# Patient Record
Sex: Male | Born: 1990
Health system: Southern US, Community
[De-identification: ages and names within clinical notes are randomized; demographics above are authoritative.]

## PROBLEM LIST (undated history)

## (undated) DIAGNOSIS — T7840XA Allergy, unspecified, initial encounter: Secondary | ICD-10-CM

## (undated) HISTORY — DX: Allergy, unspecified, initial encounter: T78.40XA

---

## 2019-02-14 HISTORY — PX: LUMBAR DISC SURGERY: SHX700

## 2019-08-06 DIAGNOSIS — M5430 Sciatica, unspecified side: Secondary | ICD-10-CM | POA: Diagnosis not present

## 2019-08-06 DIAGNOSIS — M9903 Segmental and somatic dysfunction of lumbar region: Secondary | ICD-10-CM | POA: Diagnosis not present

## 2019-08-07 DIAGNOSIS — M5432 Sciatica, left side: Secondary | ICD-10-CM | POA: Diagnosis not present

## 2019-08-07 DIAGNOSIS — M9903 Segmental and somatic dysfunction of lumbar region: Secondary | ICD-10-CM | POA: Diagnosis not present

## 2019-08-11 DIAGNOSIS — M9903 Segmental and somatic dysfunction of lumbar region: Secondary | ICD-10-CM | POA: Diagnosis not present

## 2019-08-11 DIAGNOSIS — M62830 Muscle spasm of back: Secondary | ICD-10-CM | POA: Diagnosis not present

## 2019-08-13 DIAGNOSIS — M9903 Segmental and somatic dysfunction of lumbar region: Secondary | ICD-10-CM | POA: Diagnosis not present

## 2019-08-13 DIAGNOSIS — M62830 Muscle spasm of back: Secondary | ICD-10-CM | POA: Diagnosis not present

## 2019-08-14 DIAGNOSIS — M9903 Segmental and somatic dysfunction of lumbar region: Secondary | ICD-10-CM | POA: Diagnosis not present

## 2019-08-14 DIAGNOSIS — M62830 Muscle spasm of back: Secondary | ICD-10-CM | POA: Diagnosis not present

## 2019-08-18 DIAGNOSIS — M9903 Segmental and somatic dysfunction of lumbar region: Secondary | ICD-10-CM | POA: Diagnosis not present

## 2019-08-18 DIAGNOSIS — M62830 Muscle spasm of back: Secondary | ICD-10-CM | POA: Diagnosis not present

## 2019-08-20 DIAGNOSIS — M62830 Muscle spasm of back: Secondary | ICD-10-CM | POA: Diagnosis not present

## 2019-08-20 DIAGNOSIS — M9903 Segmental and somatic dysfunction of lumbar region: Secondary | ICD-10-CM | POA: Diagnosis not present

## 2019-08-21 DIAGNOSIS — M9903 Segmental and somatic dysfunction of lumbar region: Secondary | ICD-10-CM | POA: Diagnosis not present

## 2019-08-21 DIAGNOSIS — M62830 Muscle spasm of back: Secondary | ICD-10-CM | POA: Diagnosis not present

## 2019-08-25 DIAGNOSIS — M5432 Sciatica, left side: Secondary | ICD-10-CM | POA: Diagnosis not present

## 2019-08-25 DIAGNOSIS — M9903 Segmental and somatic dysfunction of lumbar region: Secondary | ICD-10-CM | POA: Diagnosis not present

## 2019-08-27 DIAGNOSIS — M9903 Segmental and somatic dysfunction of lumbar region: Secondary | ICD-10-CM | POA: Diagnosis not present

## 2019-08-27 DIAGNOSIS — M62830 Muscle spasm of back: Secondary | ICD-10-CM | POA: Diagnosis not present

## 2019-09-03 DIAGNOSIS — M5432 Sciatica, left side: Secondary | ICD-10-CM | POA: Diagnosis not present

## 2019-09-03 DIAGNOSIS — M9903 Segmental and somatic dysfunction of lumbar region: Secondary | ICD-10-CM | POA: Diagnosis not present

## 2019-09-03 DIAGNOSIS — M62830 Muscle spasm of back: Secondary | ICD-10-CM | POA: Diagnosis not present

## 2019-09-04 DIAGNOSIS — M9903 Segmental and somatic dysfunction of lumbar region: Secondary | ICD-10-CM | POA: Diagnosis not present

## 2019-09-04 DIAGNOSIS — M5430 Sciatica, unspecified side: Secondary | ICD-10-CM | POA: Diagnosis not present

## 2019-09-08 DIAGNOSIS — M9903 Segmental and somatic dysfunction of lumbar region: Secondary | ICD-10-CM | POA: Diagnosis not present

## 2019-09-08 DIAGNOSIS — M5430 Sciatica, unspecified side: Secondary | ICD-10-CM | POA: Diagnosis not present

## 2019-09-10 DIAGNOSIS — M9903 Segmental and somatic dysfunction of lumbar region: Secondary | ICD-10-CM | POA: Diagnosis not present

## 2019-09-10 DIAGNOSIS — M5430 Sciatica, unspecified side: Secondary | ICD-10-CM | POA: Diagnosis not present

## 2019-09-12 DIAGNOSIS — M5432 Sciatica, left side: Secondary | ICD-10-CM | POA: Diagnosis not present

## 2019-09-12 DIAGNOSIS — M5431 Sciatica, right side: Secondary | ICD-10-CM | POA: Diagnosis not present

## 2019-09-15 DIAGNOSIS — M5430 Sciatica, unspecified side: Secondary | ICD-10-CM | POA: Diagnosis not present

## 2019-09-15 DIAGNOSIS — M9903 Segmental and somatic dysfunction of lumbar region: Secondary | ICD-10-CM | POA: Diagnosis not present

## 2019-09-17 DIAGNOSIS — M9903 Segmental and somatic dysfunction of lumbar region: Secondary | ICD-10-CM | POA: Diagnosis not present

## 2019-09-17 DIAGNOSIS — M5430 Sciatica, unspecified side: Secondary | ICD-10-CM | POA: Diagnosis not present

## 2019-09-24 DIAGNOSIS — M5430 Sciatica, unspecified side: Secondary | ICD-10-CM | POA: Diagnosis not present

## 2019-09-24 DIAGNOSIS — M9903 Segmental and somatic dysfunction of lumbar region: Secondary | ICD-10-CM | POA: Diagnosis not present

## 2019-09-25 DIAGNOSIS — M545 Low back pain: Secondary | ICD-10-CM | POA: Diagnosis not present

## 2019-09-25 DIAGNOSIS — M5116 Intervertebral disc disorders with radiculopathy, lumbar region: Secondary | ICD-10-CM | POA: Diagnosis not present

## 2019-10-24 DIAGNOSIS — M5116 Intervertebral disc disorders with radiculopathy, lumbar region: Secondary | ICD-10-CM | POA: Diagnosis not present

## 2019-11-26 DIAGNOSIS — M5116 Intervertebral disc disorders with radiculopathy, lumbar region: Secondary | ICD-10-CM | POA: Diagnosis not present

## 2019-11-28 DIAGNOSIS — M5117 Intervertebral disc disorders with radiculopathy, lumbosacral region: Secondary | ICD-10-CM | POA: Diagnosis not present

## 2019-11-28 DIAGNOSIS — M5126 Other intervertebral disc displacement, lumbar region: Secondary | ICD-10-CM | POA: Diagnosis not present

## 2020-03-16 DIAGNOSIS — Z4889 Encounter for other specified surgical aftercare: Secondary | ICD-10-CM | POA: Diagnosis not present

## 2020-03-30 ENCOUNTER — Encounter: Payer: Self-pay | Admitting: Physical Therapy

## 2020-03-30 ENCOUNTER — Other Ambulatory Visit: Payer: Self-pay

## 2020-03-30 ENCOUNTER — Ambulatory Visit: Payer: BC Managed Care – PPO | Attending: Orthopedic Surgery | Admitting: Physical Therapy

## 2020-03-30 DIAGNOSIS — M545 Low back pain, unspecified: Secondary | ICD-10-CM | POA: Insufficient documentation

## 2020-03-30 DIAGNOSIS — G8929 Other chronic pain: Secondary | ICD-10-CM | POA: Diagnosis not present

## 2020-03-30 DIAGNOSIS — R293 Abnormal posture: Secondary | ICD-10-CM | POA: Diagnosis not present

## 2020-03-30 NOTE — Therapy (Signed)
[facility] [address] [place], [place], [address] Phone: [phone]   Fax:  [phone]  Physical Therapy Evaluation  Patient Details  Name: Sanders Manninen MRN: [id number] Date of Birth: 1990/08/06 Referring Provider (PT): Venita Lick MD   Encounter Date: 03/30/2020   PT End of Session - 03/30/20 1440    Visit Number 1    Number of Visits 12    Date for PT Re-Evaluation 04/27/20    Authorization Type FOTO.    PT Start Time 0145    PT Stop Time 0227    PT Time Calculation (min) 42 min    Activity Tolerance Patient tolerated treatment well    Behavior During Therapy [facility] for tasks assessed/performed           History reviewed. No pertinent past medical history.  History reviewed. No pertinent surgical history.  There were no vitals filed for this visit.    Subjective Assessment - 03/30/20 1440    Subjective COVID-19 screen performed prior to patient entering clinic.  The patient presents to the clinic today s/p lumbar discectomy performed on 11/28/19.  he is pleased with the outcome of his surgery thus far and reports a low pain-level of 2/10 today. If he moves quickly is pain can rise to high levels but do not tend to last long.  Occasional he feels an ache in his left big toe but again not long lasting.  He states his surgeon said he can begin doing some lifting at home.    Pertinent History Unramarkable.    How long can you sit comfortably? Long time with readjustments.    How long can you walk comfortably? Coouple of blocks or more with some difficulty.    Patient Stated Goals Return to work (he works at a [employer]).    Currently in Pain? Yes    Pain Score 2     Pain Location Back    Pain Orientation Left    Pain Descriptors / Indicators Sore   "pulling."   Pain Type Surgical pain    Pain Onset More than a month ago    Pain Frequency Intermittent    Aggravating Factors  "Varies."    Pain Relieving Factors  Readjusting positions.              [facility] PT Assessment - 03/30/20 0001      Assessment   Medical Diagnosis Lumbar pain.    Referring Provider (PT) Venita Lick MD    Onset Date/Surgical Date 11/28/19      Precautions   Precaution Comments See lumbar discectomy protocol.      Restrictions   Weight Bearing Restrictions No      Balance Screen   Has the patient fallen in the past 6 months No    Has the patient had a decrease in activity level because of a fear of falling?  No    Is the patient reluctant to leave their home because of a fear of falling?  No      Home Environment   Living Environment Private residence      Prior Function   Level of Independence Independent      Observation/Other Assessments   Observations Lumbar incision appears to be well healed.    Focus on Therapeutic Outcomes (FOTO)  Complete.      Posture/Postural Control   Posture/Postural Control Postural limitations    Postural Limitations Decreased lumbar lordosis      Deep Tendon Reflexes  DTR Assessment Site Patella;Achilles    Patella DTR 2+    Achilles DTR 2+      ROM / Strength   AROM / PROM / Strength AROM;Strength      AROM   Overall AROM Comments Lumbar flexion limited by 25% and extension to 20 degrees.      Strength   Overall Strength Comments Normal left knee and ankle strength.      Palpation   Palpation comment Tender on left above lumbar incisional site over erector spinae musculature.      Ambulation/Gait   Gait Comments Essentially normal gait cycle.                      Objective measurements completed on examination: See above findings.       [facility] Adult PT Treatment/Exercise - 03/30/20 0001      Modalities   Modalities Electrical Stimulation;Moist Heat      Moist Heat Therapy   Number Minutes Moist Heat 15 Minutes    Moist Heat Location Lumbar Spine      Electrical Stimulation   Electrical Stimulation Location Low back.    Electrical  Stimulation Action IFC at 80-150 Hz x 15 minutes.    Electrical Stimulation Parameters 40% scan.    Electrical Stimulation Goals Pain;Tone                       PT Long Term Goals - 03/30/20 1500      PT LONG TERM GOAL #1   Title Independent with a HEP.    Time 4    Period Weeks    Status New      PT LONG TERM GOAL #2   Title Perform ADL's with pain not > 1-2/10.    Time 4    Period Weeks    Status New      PT LONG TERM GOAL #3   Title Return to work.    Time 4    Period Weeks    Status New                  Plan - 03/30/20 1452    Clinical Impression Statement The patient presents to [facility] s/p lumbar discectomy performed on 11/28/19.  He is very pleased with his surgical outcome thus far.  He has occasional but short lived pain increases with quiack movements.  He has some tenderness in his left lumbar erector spinae musculature above his incisional site.  His active spinal range of motion is quite good and his left knee and ankle strength is normal.  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  Patient will benefit from skilled physical therapy intervention to address pain and deficits.    Personal Factors and Comorbidities Profession;Other    Examination-Activity Limitations Other    Examination-Participation Restrictions Other    Stability/Clinical Decision Making Stable/Uncomplicated    Clinical Decision Making Low    Rehab Potential Excellent    PT Frequency 3x / week    PT Duration 4 weeks    PT Treatment/Interventions ADLs/Self Care Home Management;Cryotherapy;Electrical Stimulation;Ultrasound;Moist Heat;Functional mobility training;Therapeutic activities;Therapeutic exercise;Manual techniques;Patient/family education;Passive range of motion    PT Next Visit Plan Core exercise progression and instruct oncorrect body mechanics and lifting technique.  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    Consulted and Agree  with Plan of Care Patient  Patient will benefit from skilled therapeutic intervention in order to improve the following deficits and impairments:  Pain,Increased muscle spasms,Postural dysfunction,Decreased activity tolerance  Visit Diagnosis: Chronic left-sided low back pain without sciatica - Plan: PT plan of care cert/re-cert  Abnormal posture - Plan: PT plan of care cert/re-cert     Problem List There are no problems to display for this patient.   Malachy Coleman, [place] MPT 03/30/2020, 3:03 PM  [facility] [address] [place], [place], [address] Phone: [phone]   Fax:  [phone]  Name: Erek Kowal MRN: [id number] Date of Birth: 21-Aug-1990

## 2020-04-01 ENCOUNTER — Ambulatory Visit: Payer: BC Managed Care – PPO | Admitting: *Deleted

## 2020-04-01 ENCOUNTER — Other Ambulatory Visit: Payer: Self-pay

## 2020-04-01 DIAGNOSIS — M545 Low back pain, unspecified: Secondary | ICD-10-CM | POA: Diagnosis not present

## 2020-04-01 DIAGNOSIS — G8929 Other chronic pain: Secondary | ICD-10-CM | POA: Diagnosis not present

## 2020-04-01 DIAGNOSIS — R293 Abnormal posture: Secondary | ICD-10-CM | POA: Diagnosis not present

## 2020-04-01 NOTE — Therapy (Signed)
[facility] [address] [place], [place], [address] Phone: [phone]   Fax:  [phone]  Physical Therapy Treatment  Patient Details  Name: Purcell Jungbluth MRN: [id number] Date of Birth: 05-30-1990 Referring Provider (PT): Venita Lick MD   Encounter Date: 04/01/2020   PT End of Session - 04/01/20 1342    Visit Number 2    Number of Visits 12    Date for PT Re-Evaluation 04/27/20    Authorization Type FOTO.    PT Start Time 1345    PT Stop Time 1435    PT Time Calculation (min) 50 min           No past medical history on file.  No past surgical history on file.  There were no vitals filed for this visit.   Subjective Assessment - 04/01/20 1341    Pertinent History Unramarkable.    How long can you sit comfortably? Long time with readjustments.    How long can you walk comfortably? Coouple of blocks or more with some difficulty.    Patient Stated Goals Return to work (he works at a [employer]).                             [facility] Adult PT Treatment/Exercise - 04/01/20 0001      Therapeutic Activites    Therapeutic Activities Lifting    Lifting discussed power lift and kneeling techniques      Exercises   Exercises Knee/Hip;Lumbar      Lumbar Exercises: Standing   Other Standing Lumbar Exercises Standing Lat pulldown at high cabinet x10 hold 10 secs      Lumbar Exercises: Supine   Ab Set 10 reps    Bent Knee Raise 20 reps   with AB brace   Other Supine Lumbar Exercises curl up 2x10 with 1 leg straight and one bent.      Lumbar Exercises: Quadruped   Madcat/Old Horse 10 reps    Other Quadruped Lumbar Exercises Modified wt shift for arms x 10 and LEs x10      Modalities   Modalities Electrical Stimulation;Moist Heat      Moist Heat Therapy   Number Minutes Moist Heat 15 Minutes    Moist Heat Location Lumbar Spine      Electrical Stimulation   Electrical Stimulation Location Low back.     Electrical Stimulation Action IFC at 80-150hz  x 15 mins    Electrical Stimulation Parameters 80-150 hz x 15 mins    Electrical Stimulation Goals Pain;Tone                       PT Long Term Goals - 03/30/20 1500      PT LONG TERM GOAL #1   Title Independent with a HEP.    Time 4    Period Weeks    Status New      PT LONG TERM GOAL #2   Title Perform ADL's with pain not > 1-2/10.    Time 4    Period Weeks    Status New      PT LONG TERM GOAL #3   Title Return to work.    Time 4    Period Weeks    Status New                 Plan - 04/01/20 1430    Clinical Impression Statement Pt arrived today doing  fairly well and mainly with soreness in LB Rx focused on HEP core activation/ stabilization exs and body mechanics . Handout given for HEP. Did well with estim/HMP    Personal Factors and Comorbidities Profession;Other    Examination-Activity Limitations Other    Stability/Clinical Decision Making Stable/Uncomplicated    Rehab Potential Excellent    PT Frequency 3x / week    PT Treatment/Interventions ADLs/Self Care Home Management;Cryotherapy;Electrical Stimulation;Ultrasound;Moist Heat;Functional mobility training;Therapeutic activities;Therapeutic exercise;Manual techniques;Patient/family education;Passive range of motion    PT Next Visit Plan Core exercise progression and instruct oncorrect body mechanics and lifting technique.  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           Patient will benefit from skilled therapeutic intervention in order to improve the following deficits and impairments:  Pain,Increased muscle spasms,Postural dysfunction,Decreased activity tolerance  Visit Diagnosis: Chronic left-sided low back pain without sciatica  Abnormal posture     Problem List There are no problems to display for this patient.   Estell Puccini,CHRIS, PTA 04/01/2020, 2:58 PM  [facility] [address] [place], [place], [address] Phone: [phone]   Fax:  [phone]  Name: Qusay Villada MRN: [id number] Date of Birth: 06/24/1990

## 2020-04-06 ENCOUNTER — Ambulatory Visit: Payer: BC Managed Care – PPO | Admitting: *Deleted

## 2020-04-06 ENCOUNTER — Other Ambulatory Visit: Payer: Self-pay

## 2020-04-06 DIAGNOSIS — M545 Low back pain, unspecified: Secondary | ICD-10-CM

## 2020-04-06 DIAGNOSIS — G8929 Other chronic pain: Secondary | ICD-10-CM | POA: Diagnosis not present

## 2020-04-06 DIAGNOSIS — R293 Abnormal posture: Secondary | ICD-10-CM

## 2020-04-06 NOTE — Therapy (Signed)
[facility] [address] [place], [place], [address] Phone: [phone]   Fax:  [phone]  Physical Therapy Treatment  Patient Details  Name: Duane Dixon MRN: [id number] Date of Birth: 17-Dec-1990 Referring Provider (PT): Venita Lick MD   Encounter Date: 04/06/2020   PT End of Session - 04/06/20 1403    Visit Number 3    Number of Visits 12    Date for PT Re-Evaluation 04/27/20    Authorization Type FOTO.    PT Start Time 1345    PT Stop Time 1436    PT Time Calculation (min) 51 min           No past medical history on file.  No past surgical history on file.  There were no vitals filed for this visit.                      [facility] Adult PT Treatment/Exercise - 04/06/20 0001      Exercises   Exercises Knee/Hip;Lumbar      Lumbar Exercises: Aerobic   Elliptical L5 R5  x 6 mins      Lumbar Exercises: Standing   Row Strengthening;Both;20 reps   XTS blue   Shoulder Extension Strengthening;20 reps   XTS blue     Lumbar Exercises: Supine   Ab Set 10 reps    Bent Knee Raise 20 reps   with AB brace   Other Supine Lumbar Exercises curl up 2x10 with 1 leg straight and one bent.      Lumbar Exercises: Quadruped   Madcat/Old Horse 10 reps    Single Arm Raise 10 reps    Straight Leg Raises Limitations Wt shift with knee lift off table      Modalities   Modalities Electrical Stimulation;Moist Heat      Moist Heat Therapy   Number Minutes Moist Heat 12 Minutes    Moist Heat Location Lumbar Spine      Electrical Stimulation   Electrical Stimulation Location Low back.    Electrical Stimulation Action IFC    Electrical Stimulation Parameters 80-150hz  x 12 mins    Electrical Stimulation Goals Pain;Tone                       PT Long Term Goals - 03/30/20 1500      PT LONG TERM GOAL #1   Title Independent with a HEP.    Time 4    Period Weeks    Status New      PT LONG TERM GOAL #2    Title Perform ADL's with pain not > 1-2/10.    Time 4    Period Weeks    Status New      PT LONG TERM GOAL #3   Title Return to work.    Time 4    Period Weeks    Status New                 Plan - 04/06/20 1406    Clinical Impression Statement Pt arrived today doing fairly well reporting a few episodes of pain with certain movements and LB soreness.Marland Kitchen Rx focused on core activation exs in gym as well as reviewing HEP. Improved AB bracing without holding his breath today.    Personal Factors and Comorbidities Profession;Other    Examination-Participation Restrictions Other    Stability/Clinical Decision Making Stable/Uncomplicated    Rehab Potential Excellent    PT Frequency 3x / week  PT Duration 4 weeks    PT Treatment/Interventions ADLs/Self Care Home Management;Cryotherapy;Electrical Stimulation;Ultrasound;Moist Heat;Functional mobility training;Therapeutic activities;Therapeutic exercise;Manual techniques;Patient/family education;Passive range of motion    PT Next Visit Plan Core exercise progression and instruct oncorrect body mechanics and lifting technique.  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    Consulted and Agree with Plan of Care Patient           Patient will benefit from skilled therapeutic intervention in order to improve the following deficits and impairments:  Pain,Increased muscle spasms,Postural dysfunction,Decreased activity tolerance  Visit Diagnosis: Chronic left-sided low back pain without sciatica  Abnormal posture     Problem List There are no problems to display for this patient.   Jhanae Jaskowiak,CHRIS, PTA 04/06/2020, 2:47 PM  [facility] [address] [place], [place], [address] Phone: [phone]   Fax:  [phone]  Name: Duane Dixon MRN: [id number] Date of Birth: December 14, 1990

## 2020-04-08 ENCOUNTER — Other Ambulatory Visit: Payer: Self-pay

## 2020-04-08 ENCOUNTER — Ambulatory Visit: Payer: BC Managed Care – PPO | Admitting: *Deleted

## 2020-04-08 DIAGNOSIS — G8929 Other chronic pain: Secondary | ICD-10-CM

## 2020-04-08 DIAGNOSIS — R293 Abnormal posture: Secondary | ICD-10-CM | POA: Diagnosis not present

## 2020-04-08 DIAGNOSIS — M545 Low back pain, unspecified: Secondary | ICD-10-CM | POA: Diagnosis not present

## 2020-04-08 NOTE — Therapy (Signed)
[facility] [address] [place], [place], [address] Phone: [phone]   Fax:  [phone]  Physical Therapy Treatment  Patient Details  Name: Abhiram Criado MRN: [id number] Date of Birth: 11-02-1990 Referring Provider (PT): Venita Lick MD   Encounter Date: 04/08/2020   PT End of Session - 04/08/20 1404    Visit Number 4    Number of Visits 12    Date for PT Re-Evaluation 04/27/20    Authorization Type FOTO.    PT Start Time 1400    PT Stop Time 1450    PT Time Calculation (min) 50 min           No past medical history on file.  No past surgical history on file.  There were no vitals filed for this visit.   Subjective Assessment - 04/08/20 1402    Subjective COVID-19 screen performed prior to patient entering clinic.Legs are sore after last Rx, but back is doing good2/10    Pertinent History Unramarkable.    How long can you sit comfortably? Long time with readjustments.    How long can you walk comfortably? Coouple of blocks or more with some difficulty.    Currently in Pain? 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      Lumbar Exercises: Quadruped   Madcat/Old Horse 10 reps    Single Arm Raise 20 reps    Straight Leg Raises  Limitations Wt shift with knee lift off table2x10      Modalities   Modalities Electrical Stimulation;Moist Heat      Moist Heat Therapy   Number Minutes Moist Heat 15 Minutes    Moist Heat Location Lumbar Spine      Electrical Stimulation   Electrical Stimulation Location Low back.    Electrical Stimulation Action IFC    Electrical Stimulation Parameters 80-150hz  x 15 mins    Electrical Stimulation Goals Pain;Tone                       PT Long Term Goals - 03/30/20 1500      PT LONG TERM GOAL #1   Title Independent with a HEP.    Time 4    Period Weeks    Status New      PT LONG TERM GOAL #2   Title Perform ADL's with pain not > 1-2/10.    Time 4    Period Weeks  Status New      PT LONG TERM GOAL #3   Title Return to work.    Time 4    Period Weeks    Status New                 Plan - 04/08/20 1404    Clinical Impression Statement Pt arrived today reporting doing better with abdominal bracing and exs. Powerlift position performed using red ex ball and with practicing sit to stand. Pt reports being really weak in his legs and was challenged  with power lift position. Try QP leg raise next Rx.    Personal Factors and Comorbidities Profession;Other    Examination-Activity Limitations Other    Rehab Potential Excellent    PT Frequency 3x / week    PT Duration 4 weeks    PT Treatment/Interventions ADLs/Self Care Home Management;Cryotherapy;Electrical Stimulation;Ultrasound;Moist Heat;Functional mobility training;Therapeutic activities;Therapeutic exercise;Manual techniques;Patient/family education;Passive range of motion    PT Next Visit Plan Core exercise progression and instruct oncorrect body mechanics and lifting technique.  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    Consulted and Agree with Plan of Care Patient           Patient will benefit from skilled therapeutic intervention in order to improve the following  deficits and impairments:  Pain,Increased muscle spasms,Postural dysfunction,Decreased activity tolerance  Visit Diagnosis: Chronic left-sided low back pain without sciatica  Abnormal posture     Problem List There are no problems to display for this patient.   Daisy Lites,CHRIS, PTA 04/08/2020, 2:53 PM  [facility] [address] [place], [place], [address] Phone: [phone]   Fax:  [phone]  Name: Darrel Gloss MRN: [id number] Date of Birth: [birth year]

## 2020-04-13 ENCOUNTER — Ambulatory Visit: Payer: BC Managed Care – PPO | Attending: Orthopedic Surgery | Admitting: *Deleted

## 2020-04-13 ENCOUNTER — Other Ambulatory Visit: Payer: Self-pay

## 2020-04-13 DIAGNOSIS — G8929 Other chronic pain: Secondary | ICD-10-CM | POA: Insufficient documentation

## 2020-04-13 DIAGNOSIS — R293 Abnormal posture: Secondary | ICD-10-CM | POA: Insufficient documentation

## 2020-04-13 DIAGNOSIS — M545 Low back pain, unspecified: Secondary | ICD-10-CM | POA: Insufficient documentation

## 2020-04-13 NOTE — Therapy (Signed)
[facility] [address] [place], [place], [address] Phone: [phone]   Fax:  [phone]  Physical Therapy Treatment  Patient Details  Name: Duane Dixon MRN: [id number] Date of Birth: [birth year] Referring Provider (PT): Venita Lick MD   Encounter Date: 04/13/2020   PT End of Session - 04/13/20 1359    Visit Number 5    Number of Visits 12    Date for PT Re-Evaluation 04/27/20    Authorization Type FOTO.    PT Start Time 1352    PT Stop Time 1439    PT Time Calculation (min) 47 min           No past medical history on file.  No past surgical history on file.  There were no vitals filed for this visit.   Subjective Assessment - 04/13/20 1358    Subjective COVID-19 screen performed prior to patient entering clinic.Legs are sore after last Rx, but back is doing good2/10    Pertinent History Unramarkable.    How long can you sit comfortably? Long time with readjustments.    How long can you walk comfortably? Coouple of blocks or more with some difficulty.    Patient Stated Goals Return to work (he works at a [employer]).    Currently in Pain? 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      Lumbar Exercises: Quadruped   Madcat/Old Horse 10  reps    Single Arm Raise 20 reps    Straight Leg Raise 20 reps;1 second      Modalities   Modalities Electrical Stimulation;Moist Heat      Moist Heat Therapy   Number Minutes Moist Heat 15 Minutes    Moist Heat Location Lumbar Spine      Electrical Stimulation   Electrical Stimulation Location Low back.    Electrical Stimulation Action IFC    Electrical Stimulation Parameters 80-150hz  x 15 mins    Electrical Stimulation Goals Pain;Tone                       PT Long Term Goals - 03/30/20 1500      PT LONG TERM GOAL #1   Title Independent with a HEP.    Time 4    Period Weeks    Status New      PT LONG TERM GOAL #2   Title Perform ADL's with pain not > 1-2/10.    Time 4  Period Weeks    Status New      PT LONG TERM GOAL #3   Title Return to work.    Time 4    Period Weeks    Status New                 Plan - 04/13/20 1359    Clinical Impression Statement Pt arrived today doing fairly well with minimal LBP, but reports having a twinge when getting up yesterday and was advised to perform AB bracing before getting out of bed. ADL/ work postures and body mechanics were performed with notable LE weakness as per Pt. No increased pain with Exs, mainly mm tightness.    Examination-Activity Limitations Other    Examination-Participation Restrictions Other    Stability/Clinical Decision Making Stable/Uncomplicated    Rehab Potential Excellent    PT Frequency 3x / week    PT Duration 4 weeks    PT Treatment/Interventions ADLs/Self Care Home Management;Cryotherapy;Electrical Stimulation;Ultrasound;Moist Heat;Functional mobility training;Therapeutic activities;Therapeutic exercise;Manual techniques;Patient/family education;Passive range of motion    PT Next Visit Plan Core exercise progression and instruct oncorrect body mechanics and lifting technique.  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           Patient will benefit  from skilled therapeutic intervention in order to improve the following deficits and impairments:     Visit Diagnosis: Chronic left-sided low back pain without sciatica  Abnormal posture     Problem List There are no problems to display for this patient.   Ethyl Vila,CHRIS, PTA 04/13/2020, 2:57 PM  [facility] [address] [place], [place], [address] Phone: [phone]   Fax:  [phone]  Name: Demetrius Mahler MRN: [id number] Date of Birth: Dec 04, 1990

## 2020-04-15 ENCOUNTER — Other Ambulatory Visit: Payer: Self-pay

## 2020-04-15 ENCOUNTER — Ambulatory Visit: Payer: BC Managed Care – PPO | Admitting: Physical Therapy

## 2020-04-15 DIAGNOSIS — G8929 Other chronic pain: Secondary | ICD-10-CM

## 2020-04-15 DIAGNOSIS — M545 Low back pain, unspecified: Secondary | ICD-10-CM

## 2020-04-15 DIAGNOSIS — R293 Abnormal posture: Secondary | ICD-10-CM | POA: Diagnosis not present

## 2020-04-15 NOTE — Therapy (Addendum)
[facility] [address] [place], [place], [address] Phone: [phone]   Fax:  [phone]  Physical Therapy Treatment  Patient Details  Name: Halbert Jesson MRN: [id number] Date of Birth: March 20, 1990 Referring Provider (PT): Venita Lick MD   Encounter Date: 04/15/2020   PT End of Session - 04/15/20 1416    Visit Number 6    Number of Visits 12    Date for PT Re-Evaluation 04/27/20    Authorization Type FOTO.    PT Start Time 1348    PT Stop Time 1433    PT Time Calculation (min) 45 min    Activity Tolerance Patient tolerated treatment well    Behavior During Therapy [facility] for tasks assessed/performed           No past medical history on file.  No past surgical history on file.  There were no vitals filed for this visit.   Subjective Assessment - 04/15/20 1415    Subjective COVID-19 screen performed prior to patient entering clinic. Pain varies with activities.    Pertinent History Unramarkable.    How long can you sit comfortably? Long time with readjustments.    How long can you walk comfortably? Coouple of blocks or more with some difficulty.    Patient Stated Goals Return to work (he works at a [employer]).    Currently in Pain? Yes    Pain Score 2     Pain Location Back    Pain Orientation Left;Lower    Pain Descriptors / Indicators Discomfort    Pain Type Surgical pain    Pain Onset More than a month ago    Pain Frequency Intermittent              [facility] PT Assessment - 04/15/20 0001      Assessment   Medical Diagnosis Lumbar pain.    Referring Provider (PT) Venita Lick MD    Onset Date/Surgical Date 11/28/19    Next MD Visit Unsure      Precautions   Precaution Comments See lumbar discectomy protocol.                         [facility] Adult PT Treatment/Exercise - 04/15/20 0001      Lumbar Exercises: Aerobic   Elliptical L5 R5  x 6 mins      Lumbar Exercises: Machines for Strengthening    Leg Press 2.5 pl, seat 6 x20 reps      Lumbar Exercises: Standing   Row Strengthening;Both;20 reps;Limitations    Row Limitations Blue XTS    Shoulder Extension Strengthening;Both;20 reps;Limitations    Shoulder Extension Limitations Blue XTS    Other Standing Lumbar Exercises B chop/lift blue XTS x20 reps each    Other Standing Lumbar Exercises modified squats to high plinth 10# x20 reps      Lumbar Exercises: Prone   Opposite Arm/Leg Raise Right arm/Left leg;Left arm/Right leg;10 reps      Lumbar Exercises: Quadruped   Madcat/Old Horse 10 reps    Single Arm Raise Right;Left;10 reps    Straight Leg Raise 20 reps;1 second             **Electrical stimulation: B low back     Pre-Mod      80-150 hz x10 min for pain control          PT Long Term Goals - 04/15/20 1417      PT LONG TERM GOAL #1  Title Independent with a HEP.    Time 4    Period Weeks    Status On-going      PT LONG TERM GOAL #2   Title Perform ADL's with pain not > 1-2/10.    Time 4    Period Weeks    Status On-going      PT LONG TERM GOAL #3   Title Return to work.    Time 4    Period Weeks    Status On-going                 Plan - 04/15/20 1624    Clinical Impression Statement Patient presented in clinic with reports of minimal LBP. Patient progressed to LE strengthening and well as lumbar stability exercises. Patient able to demonstrate good core activation and technique with min-mod multimodal cueing to emphasize technique. Patient able to demonstrate good overall modified squat technique today as well. Normal stimulation response noted following removal of the modality.    Personal Factors and Comorbidities Profession;Other    Examination-Activity Limitations Other    Examination-Participation Restrictions Other    Stability/Clinical Decision Making Stable/Uncomplicated    Rehab Potential Excellent    PT Frequency 3x / week    PT Duration 4 weeks    PT  Treatment/Interventions ADLs/Self Care Home Management;Cryotherapy;Electrical Stimulation;Ultrasound;Moist Heat;Functional mobility training;Therapeutic activities;Therapeutic exercise;Manual techniques;Patient/family education;Passive range of motion    PT Next Visit Plan Core exercise progression and instruct oncorrect body mechanics and lifting technique.  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    Consulted and Agree with Plan of Care Patient           Patient will benefit from skilled therapeutic intervention in order to improve the following deficits and impairments:  Pain,Increased muscle spasms,Postural dysfunction,Decreased activity tolerance  Visit Diagnosis: Chronic left-sided low back pain without sciatica  Abnormal posture     Problem List There are no problems to display for this patient.   Marvell Fuller, PTA 04/15/2020, 4:31 PM  [facility] [address] [place], [place], [address] Phone: [phone]   Fax:  [phone]  Name: Cesario Weidinger MRN: [id number] Date of Birth: Feb 16, 1990

## 2020-04-20 ENCOUNTER — Other Ambulatory Visit: Payer: Self-pay

## 2020-04-20 ENCOUNTER — Ambulatory Visit: Payer: BC Managed Care – PPO | Admitting: Physical Therapy

## 2020-04-20 DIAGNOSIS — M545 Low back pain, unspecified: Secondary | ICD-10-CM | POA: Diagnosis not present

## 2020-04-20 DIAGNOSIS — G8929 Other chronic pain: Secondary | ICD-10-CM

## 2020-04-20 DIAGNOSIS — R293 Abnormal posture: Secondary | ICD-10-CM | POA: Diagnosis not present

## 2020-04-20 NOTE — Therapy (Signed)
[facility] [address] [place], [place], [address] Phone: [phone]   Fax:  [phone]  Physical Therapy Treatment  Patient Details  Name: Duane Dixon MRN: [id number] Date of Birth: 27-Jan-1991 Referring Provider (PT): Venita Lick MD   Encounter Date: 04/20/2020   PT End of Session - 04/20/20 1444    Visit Number 7    Number of Visits 12    Date for PT Re-Evaluation 04/27/20    Authorization Type FOTO.    PT Start Time 0153    PT Stop Time 0244    PT Time Calculation (min) 51 min    Activity Tolerance Patient tolerated treatment well    Behavior During Therapy [facility] for tasks assessed/performed           No past medical history on file.  No past surgical history on file.  There were no vitals filed for this visit.   Subjective Assessment - 04/20/20 1403    Subjective COVID-19 screen performed prior to patient entering clinic.  Doing good.    Pertinent History Unramarkable.    How long can you sit comfortably? Long time with readjustments.    How long can you walk comfortably? Coouple of blocks or more with some difficulty.    Patient Stated Goals Return to work (he works at a [employer]).    Currently in Pain? Yes    Pain Score 2     Pain Location Back    Pain Orientation Left    Pain Type Surgical pain    Pain Onset More than a month ago                             [facility] Adult PT Treatment/Exercise - 04/20/20 0001      Exercises   Exercises Knee/Hip      Lumbar Exercises: Aerobic   Elliptical L5/% x 8 minutes (4 minutes forward and 4 minutes backward)      Lumbar Exercises: Machines for Strengthening   Cybex Lumbar Extension 60# limited range x 3 minutes.    Leg Press 3 plates x 3 minutes.    Other Lumbar Machine Exercise ab machine 60# x 3 minutes limited range      Knee/Hip Exercises: Machines for Strengthening   Cybex Knee Extension 10# x 3 minutes.    Cybex Knee Flexion 30# x 3  minutes.      Modalities   Modalities Electrical Stimulation;Moist Heat      Moist Heat Therapy   Number Minutes Moist Heat 20 Minutes    Moist Heat Location Lumbar Spine      Electrical Stimulation   Electrical Stimulation Location Low back    Electrical Stimulation Action IFC    Electrical Stimulation Parameters 80-150 Hz x 20 minutes.    Electrical Stimulation Goals Pain                       PT Long Term Goals - 04/15/20 1417      PT LONG TERM GOAL #1   Title Independent with a HEP.    Time 4    Period Weeks    Status On-going      PT LONG TERM GOAL #2   Title Perform ADL's with pain not > 1-2/10.    Time 4    Period Weeks    Status On-going      PT LONG TERM GOAL #3   Title  Return to work.    Time 4    Period Weeks    Status On-going                 Plan - 04/20/20 1425    Clinical Impression Statement Patient is very motivated.  He did great with the addition of knee extension, ham curls and limited range lumbar extension and ab curls.  Excellent technique without complaint.    Personal Factors and Comorbidities Profession;Other    Examination-Participation Restrictions Other    Stability/Clinical Decision Making Stable/Uncomplicated    Rehab Potential Excellent    PT Frequency 3x / week    PT Duration 4 weeks    PT Treatment/Interventions ADLs/Self Care Home Management;Cryotherapy;Electrical Stimulation;Ultrasound;Moist Heat;Functional mobility training;Therapeutic activities;Therapeutic exercise;Manual techniques;Patient/family education;Passive range of motion    PT Next Visit Plan Core exercise progression and instruct oncorrect body mechanics and lifting technique.  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    Consulted and Agree with Plan of Care Patient           Patient will benefit from skilled therapeutic intervention in order to improve the following deficits and impairments:  Pain,Increased muscle  spasms,Postural dysfunction,Decreased activity tolerance  Visit Diagnosis: Chronic left-sided low back pain without sciatica  Abnormal posture     Problem List There are no problems to display for this patient.   Morissa Obeirne, [place] MPT 04/20/2020, 2:45 PM  [facility] [address] [place], [place], [address] Phone: [phone]   Fax:  [phone]  Name: Duane Dixon MRN: [id number] Date of Birth: [birth year]

## 2020-04-22 ENCOUNTER — Ambulatory Visit: Payer: BC Managed Care – PPO | Admitting: *Deleted

## 2020-04-22 ENCOUNTER — Other Ambulatory Visit: Payer: Self-pay

## 2020-04-22 DIAGNOSIS — R293 Abnormal posture: Secondary | ICD-10-CM

## 2020-04-22 DIAGNOSIS — M545 Low back pain, unspecified: Secondary | ICD-10-CM | POA: Diagnosis not present

## 2020-04-22 DIAGNOSIS — G8929 Other chronic pain: Secondary | ICD-10-CM | POA: Diagnosis not present

## 2020-04-22 NOTE — Therapy (Signed)
[facility] [address] [place], [place], [address] Phone: [phone]   Fax:  [phone]  Physical Therapy Treatment  Patient Details  Name: Duane Dixon MRN: [id number] Date of Birth: Jun 27, 1990 Referring Provider (PT): Venita Lick MD   Encounter Date: 04/22/2020   PT End of Session - 04/22/20 1402    Visit Number 8    Number of Visits 12    Date for PT Re-Evaluation 04/27/20    Authorization Type FOTO.    PT Start Time 1350    PT Stop Time 1440    PT Time Calculation (min) 50 min           No past medical history on file.  No past surgical history on file.  There were no vitals filed for this visit.   Subjective Assessment - 04/22/20 1358    Subjective COVID-19 screen performed prior to patient entering clinic.  Doing good. BTW when PT is over    Pertinent History Unramarkable.    How long can you sit comfortably? Long time with readjustments.    How long can you walk comfortably? Coouple of blocks or more with some difficulty.    Patient Stated Goals Return to work (he works at a [employer]).    Currently in Pain? Yes    Pain Score 2     Pain Location Back    Pain Orientation Left    Pain Descriptors / Indicators Tightness    Pain Type Surgical pain    Pain Onset More than a month ago                             [facility] Adult PT Treatment/Exercise - 04/22/20 0001      Exercises   Exercises Knee/Hip;Lumbar      Lumbar Exercises: Aerobic   Elliptical L6/6 x 10 minutes      Lumbar Exercises: Standing   Other Standing Lumbar Exercises 14# box lifts from mat table to floor using power lift position and kneeling for work simulation.    Other Standing Lumbar Exercises 10# lifts at door 3 directions x10 2 sets each      Lumbar Exercises: Quadruped   Madcat/Old Horse 10 reps    Straight Leg Raise 20 reps;1 second    Opposite Arm/Leg Raise 5 reps    Opposite Arm/Leg Raise Limitations unable to  maintain balance      Modalities   Modalities Electrical Stimulation;Moist Heat      Moist Heat Therapy   Number Minutes Moist Heat 15 Minutes    Moist Heat Location Lumbar Spine      Electrical Stimulation   Electrical Stimulation Location Low back    Electrical Stimulation Action IFC    Electrical Stimulation Parameters 80-150hz  x 15 mins    Electrical Stimulation Goals Pain                       PT Long Term Goals - 04/15/20 1417      PT LONG TERM GOAL #1   Title Independent with a HEP.    Time 4    Period Weeks    Status On-going      PT LONG TERM GOAL #2   Title Perform ADL's with pain not > 1-2/10.    Time 4    Period Weeks    Status On-going      PT LONG TERM GOAL #3  Title Return to work.    Time 4    Period Weeks    Status On-going                 Plan - 04/22/20 1529    Clinical Impression Statement Pt arrived today doing fairly well with low pain levels. He was able to perform lifts from mat to floor and back with 14# box with power lifts and kneeling. Core exs performed in QP position with alternate arm/ leg raise, but pt unable to hold neutral position and balance. He did well with just alternate leg raises.    Personal Factors and Comorbidities Profession;Other    Stability/Clinical Decision Making Stable/Uncomplicated    Rehab Potential Excellent    PT Frequency 3x / week    PT Duration 4 weeks    PT Treatment/Interventions ADLs/Self Care Home Management;Cryotherapy;Electrical Stimulation;Ultrasound;Moist Heat;Functional mobility training;Therapeutic activities;Therapeutic exercise;Manual techniques;Patient/family education;Passive range of motion    PT Next Visit Plan Core exercise progression and instruct oncorrect body mechanics and lifting technique.  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           Patient will benefit from skilled therapeutic intervention in order to improve the following  deficits and impairments:  Pain,Increased muscle spasms,Postural dysfunction,Decreased activity tolerance  Visit Diagnosis: Chronic left-sided low back pain without sciatica  Abnormal posture     Problem List There are no problems to display for this patient.   Duane Dixon,Duane Dixon, PTA 04/22/2020, 3:53 PM  [facility] [address] [place], [place], [address] Phone: [phone]   Fax:  [phone]  Name: Duane Dixon MRN: [id number] Date of Birth: 09/24/1990

## 2020-04-27 ENCOUNTER — Ambulatory Visit: Payer: BC Managed Care – PPO | Admitting: Physical Therapy

## 2020-04-27 ENCOUNTER — Other Ambulatory Visit: Payer: Self-pay

## 2020-04-27 DIAGNOSIS — G8929 Other chronic pain: Secondary | ICD-10-CM

## 2020-04-27 DIAGNOSIS — M545 Low back pain, unspecified: Secondary | ICD-10-CM

## 2020-04-27 DIAGNOSIS — R293 Abnormal posture: Secondary | ICD-10-CM | POA: Diagnosis not present

## 2020-04-27 NOTE — Therapy (Signed)
[facility] [address] [place], [place], [address] Phone: [phone]   Fax:  [phone]  Physical Therapy Treatment  Patient Details  Name: Duane Dixon MRN: [id number] Date of Birth: 21-Jul-1990 Referring Maikol Grassia (PT): Venita Lick MD   Encounter Date: 04/27/2020   PT End of Session - 04/27/20 1422    Visit Number 9    Number of Visits 12    Date for PT Re-Evaluation 04/27/20    Authorization Type FOTO.    PT Start Time 0153    PT Stop Time 0242    PT Time Calculation (min) 49 min    Activity Tolerance Patient tolerated treatment well    Behavior During Therapy [facility] for tasks assessed/performed           No past medical history on file.  No past surgical history on file.  There were no vitals filed for this visit.   Subjective Assessment - 04/27/20 1422    Subjective COVID-19 screen performed prior to patient entering clinic.  Patient pleased with progress.    Pertinent History Unramarkable.    How long can you sit comfortably? Long time with readjustments.    How long can you walk comfortably? Coouple of blocks or more with some difficulty.    Patient Stated Goals Return to work (he works at a [employer]).    Currently in Pain? Yes    Pain Score 2     Pain Location Back    Pain Orientation Left    Pain Descriptors / Indicators Tightness    Pain Type Surgical pain    Pain Onset More than a month ago                             [facility] Adult PT Treatment/Exercise - 04/27/20 0001      Exercises   Exercises Knee/Hip;Lumbar      Lumbar Exercises: Aerobic   Elliptical Level 5/5 x 10 minutes.      Lumbar Exercises: Machines for Strengthening   Cybex Lumbar Extension 60# x 3 minutes.    Other Lumbar Machine Exercise ab crunch at 60# x 3 minutes.      Knee/Hip Exercises: Machines for Strengthening   Cybex Knee Extension 20# x 2 minutes.    Cybex Knee Flexion 40# x 2 minutes.      Knee/Hip  Exercises: Standing   Other Standing Knee Exercises Wall slides on plexiglass to faigue x 2.      Knee/Hip Exercises: Supine   Other Supine Knee/Hip Exercises Hip bridges with steps to fatigue.      Modalities   Modalities Electrical Stimulation;Moist Heat      Moist Heat Therapy   Number Minutes Moist Heat 20 Minutes    Moist Heat Location Lumbar Spine      Electrical Stimulation   Electrical Stimulation Location LB    Electrical Stimulation Action IFC at 80-150 Hz x 20 minutes.    Electrical Stimulation Goals Pain                       PT Long Term Goals - 04/15/20 1417      PT LONG TERM GOAL #1   Title Independent with a HEP.    Time 4    Period Weeks    Status On-going      PT LONG TERM GOAL #2   Title Perform ADL's with pain not > 1-2/10.  Time 4    Period Weeks    Status On-going      PT LONG TERM GOAL #3   Title Return to work.    Time 4    Period Weeks    Status On-going                 Plan - 04/27/20 1426    Clinical Impression Statement The patient did great today.  He is highly motivated and does exercise with excellent technique.  He tolerated treatment without complaint.    Personal Factors and Comorbidities Profession;Other    Stability/Clinical Decision Making Stable/Uncomplicated    PT Frequency 3x / week    PT Duration 4 weeks    PT Treatment/Interventions ADLs/Self Care Home Management;Cryotherapy;Electrical Stimulation;Ultrasound;Moist Heat;Functional mobility training;Therapeutic activities;Therapeutic exercise;Manual techniques;Patient/family education;Passive range of motion    PT Next Visit Plan Core exercise progression and instruct oncorrect body mechanics and lifting technique.  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           Patient will benefit from skilled therapeutic intervention in order to improve the following deficits and impairments:  Pain,Increased muscle spasms,Postural  dysfunction,Decreased activity tolerance  Visit Diagnosis: Chronic left-sided low back pain without sciatica  Abnormal posture     Problem List There are no problems to display for this patient.   [place], [place]  MPT 04/27/2020, 2:42 PM  [facility] [address] [place], [place], [address] Phone: [phone]   Fax:  [phone]  Name: Duane Dixon MRN: [id number] Date of Birth: [birth year]

## 2020-04-29 ENCOUNTER — Other Ambulatory Visit: Payer: Self-pay

## 2020-04-29 ENCOUNTER — Ambulatory Visit: Payer: BC Managed Care – PPO | Admitting: Physical Therapy

## 2020-04-29 DIAGNOSIS — G8929 Other chronic pain: Secondary | ICD-10-CM | POA: Diagnosis not present

## 2020-04-29 DIAGNOSIS — R293 Abnormal posture: Secondary | ICD-10-CM | POA: Diagnosis not present

## 2020-04-29 DIAGNOSIS — M545 Low back pain, unspecified: Secondary | ICD-10-CM

## 2020-04-29 NOTE — Therapy (Signed)
[facility] [place], [place], [address] Phone: [phone]   Fax:  [phone]  Physical Therapy Treatment  Patient Details  Name: Duane Dixon MRN: [id number] Date of Birth: 09-27-1990 Referring Provider (PT): Melina Schools MD   Encounter Date: 04/29/2020   PT End of Session - 04/29/20 1313    Visit Number 10    Number of Visits 12    Date for PT Re-Evaluation 04/27/20    Authorization Type FOTO. 10th visit 42%    PT Start Time 0108    PT Stop Time 0155    PT Time Calculation (min) 47 min    Activity Tolerance Patient tolerated treatment well    Behavior During Therapy [facility] for tasks assessed/performed           No past medical history on file.  No past surgical history on file.  There were no vitals filed for this visit.   Subjective Assessment - 04/29/20 1310    Subjective COVID-19 screen performed prior to patient entering clinic.  Patient arrived with lilltle discomfort.    Pertinent History Unramarkable.    How long can you sit comfortably? Long time with readjustments.    How long can you walk comfortably? Coouple of blocks or more with some difficulty.    Patient Stated Goals Return to work (he works at a [employer]).    Currently in Pain? Yes    Pain Score 2     Pain Location Back    Pain Orientation Left;Lower;Mid    Pain Descriptors / Indicators Discomfort    Pain Type Chronic pain;Acute pain    Pain Onset More than a month ago    Pain Frequency Intermittent    Aggravating Factors  certain movements    Pain Relieving Factors movement                             [facility] Adult PT Treatment/Exercise - 04/29/20 0001      Lumbar Exercises: Aerobic   Elliptical Level 5/5 x 10 minutes.      Lumbar Exercises: Machines for Strengthening   Cybex Lumbar Extension 60# x 3 minutes.    Other Lumbar Machine Exercise ab crunch at 60# x 3 minutes.      Lumbar Exercises: Supine   Bridge 10  reps    Bridge with clamshell 10 reps   with green band   Straight Leg Raise 3 seconds   2x10     Lumbar Exercises: Quadruped   Opposite Arm/Leg Raise Right arm/Left leg;Left arm/Right leg;5 reps   bil sides     Knee/Hip Exercises: Machines for Strengthening   Cybex Knee Extension 20# x 2 minutes.      Moist Heat Therapy   Number Minutes Moist Heat 10 Minutes    Moist Heat Location Lumbar Spine      Electrical Stimulation   Electrical Stimulation Location LB    Electrical Stimulation Action IFC 80-150hz x41mn    Electrical Stimulation Goals Pain   post exercises requested                      PT Long Term Goals - 04/29/20 1313      PT LONG TERM GOAL #1   Title Independent with a HEP.    Baseline reported doing well with his current HEP 04/29/20    Time 4    Period Weeks    Status Achieved  PT LONG TERM GOAL #2   Title Perform ADL's with pain not > 1-2/10.    Time 4    Period Weeks    Status On-going      PT LONG TERM GOAL #3   Title Return to work.    Baseline has not returned to work 04/29/20    Time 4    Period Weeks    Status On-going                 Plan - 04/29/20 1348    Clinical Impression Statement Patient tolerated treatment well today. Patient able to progress with core activities with no increased pain. Patient has reported doing well with HEP. Patient has not returned back to work at this time. LTG #1 met with remaining progressing.    Personal Factors and Comorbidities Profession;Other    Examination-Activity Limitations Other    Examination-Participation Restrictions Other    Stability/Clinical Decision Making Stable/Uncomplicated    Rehab Potential Excellent    PT Frequency 3x / week    PT Duration 4 weeks    PT Treatment/Interventions ADLs/Self Care Home Management;Cryotherapy;Electrical Stimulation;Ultrasound;Moist Heat;Functional mobility training;Therapeutic activities;Therapeutic exercise;Manual techniques;Patient/family  education;Passive range of motion    PT Next Visit Plan Core exercise progression and instruct oncorrect body mechanics and lifting technique.  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    Consulted and Agree with Plan of Care Patient           Patient will benefit from skilled therapeutic intervention in order to improve the following deficits and impairments:  Pain,Increased muscle spasms,Postural dysfunction,Decreased activity tolerance  Visit Diagnosis: Chronic left-sided low back pain without sciatica  Abnormal posture     Problem List There are no problems to display for this patient.   Ladean Raya, PTA 04/29/20 1:58 PM  [facility] [place], [place], [address] Phone: [phone]   Fax:  [phone]  Name: Duane Dixon MRN: [id number] Date of Birth: 1990-07-07  Progress Note Reporting Period 03/30/20 to 04/29/20.   See note below for Objective Data and Assessment of Progress/Goals. Patient is highly motivated and has progress very well.  LTG #1 is met and remaining are expected to be met.    Mali Applegate MPT

## 2020-05-04 ENCOUNTER — Other Ambulatory Visit: Payer: Self-pay

## 2020-05-04 ENCOUNTER — Ambulatory Visit: Payer: BC Managed Care – PPO | Admitting: *Deleted

## 2020-05-04 DIAGNOSIS — G8929 Other chronic pain: Secondary | ICD-10-CM | POA: Diagnosis not present

## 2020-05-04 DIAGNOSIS — M545 Low back pain, unspecified: Secondary | ICD-10-CM

## 2020-05-04 DIAGNOSIS — R293 Abnormal posture: Secondary | ICD-10-CM | POA: Diagnosis not present

## 2020-05-04 NOTE — Therapy (Signed)
[facility] [address] [place], [place], [address] Phone: [phone]   Fax:  [phone]  Physical Therapy Treatment  Patient Details  Name: Duane Dixon MRN: [id number] Date of Birth: 05/17/1990 Referring Provider (PT): Venita Lick MD   Encounter Date: 05/04/2020   PT End of Session - 05/04/20 1402    Visit Number 11    Number of Visits 12    Date for PT Re-Evaluation 04/27/20    Authorization Type FOTO. 10th visit 42%    PT Start Time 1352    PT Stop Time 1439    PT Time Calculation (min) 47 min           No past medical history on file.  No past surgical history on file.  There were no vitals filed for this visit.   Subjective Assessment - 05/04/20 1402    Subjective COVID-19 screen performed prior to patient entering clinic.  Patient arrived with lilltle discomfort.2/10 today    Pertinent History Unramarkable.    How long can you sit comfortably? Long time with readjustments.    How long can you walk comfortably? Coouple of blocks or more with some difficulty.    Patient Stated Goals Return to work (he works at a [employer]).    Currently in Pain? Yes    Pain Score 2     Pain Location Back    Pain Orientation Left;Lower    Pain Descriptors / Indicators Discomfort                             [facility] Adult PT Treatment/Exercise - 05/04/20 0001      Exercises   Exercises Knee/Hip;Lumbar      Lumbar Exercises: Aerobic   Elliptical Level 5/5 x 10 minutes.      Lumbar Exercises: Supine   Bridge 10 reps    Bridge with clamshell 10 reps   with green band     Lumbar Exercises: Quadruped   Opposite Arm/Leg Raise Right arm/Left leg;Left arm/Right leg;10 reps   hold 10 secs     Modalities   Modalities Electrical Stimulation;Moist Heat      Moist Heat Therapy   Number Minutes Moist Heat 12 Minutes    Moist Heat Location Lumbar Spine      Electrical Stimulation   Electrical Stimulation  Location LB    Electrical Stimulation Action IFC 80-150hz  x 12 mins    Electrical Stimulation Goals Pain                       PT Long Term Goals - 04/29/20 1313      PT LONG TERM GOAL #1   Title Independent with a HEP.    Baseline reported doing well with his current HEP 04/29/20    Time 4    Period Weeks    Status Achieved      PT LONG TERM GOAL #2   Title Perform ADL's with pain not > 1-2/10.    Time 4    Period Weeks    Status On-going      PT LONG TERM GOAL #3   Title Return to work.    Baseline has not returned to work 04/29/20    Time 4    Period Weeks    Status On-going                 Plan - 05/04/20 1403  Clinical Impression Statement Pt arrived today doing fairly well and reports low pain levels. Rx focused on core strengthening. QP with alt arm/leg still the most challenging. Normal modality response    Personal Factors and Comorbidities Profession;Other    Examination-Activity Limitations Other    Examination-Participation Restrictions Other    Stability/Clinical Decision Making Stable/Uncomplicated    Rehab Potential Excellent    PT Frequency 3x / week    PT Duration 4 weeks           Patient will benefit from skilled therapeutic intervention in order to improve the following deficits and impairments:  Pain,Increased muscle spasms,Postural dysfunction,Decreased activity tolerance  Visit Diagnosis: Chronic left-sided low back pain without sciatica  Abnormal posture     Problem List There are no problems to display for this patient.   Jonothan Heberle,CHRIS, PTA 05/04/2020, 6:40 PM  [facility] [address] [place], [place], [address] Phone: [phone]   Fax:  [phone]  Name: Duane Dixon MRN: [id number] Date of Birth: Nov 15, 1990

## 2020-05-06 ENCOUNTER — Ambulatory Visit: Payer: BC Managed Care – PPO | Admitting: Physical Therapy

## 2020-05-06 ENCOUNTER — Other Ambulatory Visit: Payer: Self-pay

## 2020-05-06 DIAGNOSIS — R293 Abnormal posture: Secondary | ICD-10-CM

## 2020-05-06 DIAGNOSIS — M545 Low back pain, unspecified: Secondary | ICD-10-CM | POA: Diagnosis not present

## 2020-05-06 DIAGNOSIS — G8929 Other chronic pain: Secondary | ICD-10-CM | POA: Diagnosis not present

## 2020-05-06 NOTE — Therapy (Addendum)
[facility] [place], [place], [address] Phone: [phone]   Fax:  [phone]  Physical Therapy Treatment  Patient Details  Name: Duane Dixon MRN: [id number] Date of Birth: 05/24/1990 Referring Provider (PT): Melina Schools MD   Encounter Date: 05/06/2020   PT End of Session - 05/06/20 1421     Visit Number 12    Number of Visits 12    Date for PT Re-Evaluation 04/27/20    Authorization Type FOTO 12 visit    PT Start Time 0145    PT Stop Time 0234    PT Time Calculation (min) 49 min    Activity Tolerance Patient tolerated treatment well    Behavior During Therapy [facility] for tasks assessed/performed             No past medical history on file.  No past surgical history on file.  There were no vitals filed for this visit.   Subjective Assessment - 05/06/20 1351     Subjective COVID-19 screen performed prior to patient entering clinic.  Patient reported doing well with less discomfort in back.    Pertinent History Unramarkable.    How long can you sit comfortably? Long time with readjustments.    How long can you walk comfortably? Coouple of blocks or more with some difficulty.    Patient Stated Goals Return to work (he works at a [employer]).    Currently in Pain? Yes    Pain Score 2     Pain Location Back    Pain Orientation Left;Lower    Pain Descriptors / Indicators Discomfort    Pain Type Surgical pain    Pain Onset More than a month ago    Pain Frequency Intermittent    Aggravating Factors  certain movements    Pain Relieving Factors rest                               [facility] Adult PT Treatment/Exercise - 05/06/20 0001       Lumbar Exercises: Aerobic   Elliptical Level 5/5 x 10 minutes.      Lumbar Exercises: Machines for Strengthening   Cybex Lumbar Extension 60# x 3 minutes.    Other Lumbar Machine Exercise ab crunch at 60# x 3 minutes.      Lumbar Exercises: Supine    Bridge with clamshell 20 reps   with green t-band   Straight Leg Raise 3 seconds   2x10     Lumbar Exercises: Quadruped   Opposite Arm/Leg Raise Right arm/Left leg;Left arm/Right leg;10 reps;5 seconds;4 seconds;1 second      Knee/Hip Exercises: Standing   Wall Squat Other (comment)   2 min with rests as needed     Moist Heat Therapy   Number Minutes Moist Heat 15 Minutes    Moist Heat Location Lumbar Spine      Electrical Stimulation   Electrical Stimulation Location LB    Electrical Stimulation Action IFC 80-'150hz'  x84mn    Electrical Stimulation Goals Pain                         PT Long Term Goals - 05/06/20 1358       PT LONG TERM GOAL #1   Title Independent with a HEP.    Baseline reported doing well with his current HEP 04/29/20    Time 4    Period Weeks  Status Achieved      PT LONG TERM GOAL #2   Title Perform ADL's with pain not > 1-2/10.    Baseline Pain range 2-3/10 05/06/20    Time 4    Period Weeks    Status On-going      PT LONG TERM GOAL #3   Title Return to work.    Baseline has not returned to work 05/06/20    Time 4    Period Weeks    Status On-going                   Plan - 05/06/20 1413     Clinical Impression Statement Patient arrived with overall less pain and doing well. Patient has improved with core strength and is independent with posture awareness techniques. Patient is doing HEP as directed and independent with home progression. Patient has not returned to work at this time due to MD approval. Patient has 2-3/10 pain day to day with ADL's. Patient remaining goals progresisng.    Personal Factors and Comorbidities Profession;Other    Examination-Activity Limitations Other    Examination-Participation Restrictions Other    Stability/Clinical Decision Making Stable/Uncomplicated    Rehab Potential Excellent    PT Frequency 3x / week    PT Duration 4 weeks    PT Treatment/Interventions ADLs/Self Care Home  Management;Cryotherapy;Electrical Stimulation;Ultrasound;Moist Heat;Functional mobility training;Therapeutic activities;Therapeutic exercise;Manual techniques;Patient/family education;Passive range of motion    PT Next Visit Plan on hold pending MD F/U appt to return to work    [employer] and Agree with Plan of Care Patient             Patient will benefit from skilled therapeutic intervention in order to improve the following deficits and impairments:  Pain,Increased muscle spasms,Postural dysfunction,Decreased activity tolerance  Visit Diagnosis: Chronic left-sided low back pain without sciatica  Abnormal posture     Problem List There are no problems to display for this patient.   Ladean Raya, PTA 05/06/20 2:37 PM  [facility] [place], [place], [address] Phone: [phone]   Fax:  [phone]  Name: Duane Dixon MRN: [id number] Date of Birth: Jun 14, 1990  PHYSICAL THERAPY DISCHARGE SUMMARY  Visits from Start of Care: 12.  Current functional level related to goals / functional outcomes: See above.   Remaining deficits: See below.   Education / Equipment: HEP.   Patient agrees to discharge. Patient goals were partially met. Patient is being discharged due to being pleased with the current functional level.    Mali Applegate MPT

## 2020-05-10 DIAGNOSIS — M5416 Radiculopathy, lumbar region: Secondary | ICD-10-CM | POA: Diagnosis not present

## 2020-06-16 ENCOUNTER — Ambulatory Visit: Payer: BC Managed Care – PPO | Admitting: Family Medicine

## 2020-08-12 ENCOUNTER — Other Ambulatory Visit: Payer: Self-pay

## 2020-08-12 ENCOUNTER — Encounter: Payer: Self-pay | Admitting: Family Medicine

## 2020-08-12 ENCOUNTER — Ambulatory Visit (INDEPENDENT_AMBULATORY_CARE_PROVIDER_SITE_OTHER): Payer: BC Managed Care – PPO | Admitting: Family Medicine

## 2020-08-12 VITALS — BP 172/88 | HR 98 | Temp 98.8°F | Ht 72.0 in | Wt 259.4 lb

## 2020-08-12 DIAGNOSIS — Z23 Encounter for immunization: Secondary | ICD-10-CM

## 2020-08-12 DIAGNOSIS — Z1159 Encounter for screening for other viral diseases: Secondary | ICD-10-CM

## 2020-08-12 DIAGNOSIS — Z6835 Body mass index (BMI) 35.0-35.9, adult: Secondary | ICD-10-CM

## 2020-08-12 DIAGNOSIS — Z7689 Persons encountering health services in other specified circumstances: Secondary | ICD-10-CM

## 2020-08-12 DIAGNOSIS — Z114 Encounter for screening for human immunodeficiency virus [HIV]: Secondary | ICD-10-CM | POA: Diagnosis not present

## 2020-08-12 DIAGNOSIS — M25572 Pain in left ankle and joints of left foot: Secondary | ICD-10-CM

## 2020-08-12 DIAGNOSIS — I10 Essential (primary) hypertension: Secondary | ICD-10-CM

## 2020-08-12 MED ORDER — LISINOPRIL 10 MG PO TABS: 10.0000 mg | ORAL_TABLET | Freq: Every day | ORAL | 3 refills | Status: DC

## 2020-08-12 MED ORDER — PREDNISONE 20 MG PO TABS: 40.0000 mg | ORAL_TABLET | Freq: Every day | ORAL | 0 refills | Status: AC

## 2020-08-12 MED ORDER — DICLOFENAC SODIUM 75 MG PO TBEC
75.0000 mg | DELAYED_RELEASE_TABLET | Freq: Two times a day (BID) | ORAL | 1 refills | Status: DC
Start: 2020-08-12 — End: 2020-09-09

## 2020-08-12 NOTE — Patient Instructions (Signed)

## 2020-08-12 NOTE — Progress Notes (Signed)
New Patient Office Visit  Subjective:  Patient ID: Duane Dixon, male    DOB: Jul 18, 1990  Age: 30 y.o. MRN: [id number]  CC:  Chief Complaint  Patient presents with   New Patient (Initial Visit)    HPI Duane Dixon presents to establish care.   He was told when he had his back surgery that his blood pressure was elevated. He has not followed up regarding this but states he has had multiple elevated readings in the past. He denies headaches, chest pain, shortness of breath, edema, visual disturbances, palpitations, or dizziness.   He reports of left ankle pain that is intermittent for years. He bought a new pair of shoes and this cause his ankles to hurt. His left ankle has been hurting daily since this. Denies redness, warmth, or swelling. The pain is like a stiffness. It is achy and sometimes sharp. The pain is worse with increased activity. Improves with rest. He has tried ibuprofen and tylenol with some improvement. He stands for long periods on time on concrete for work. Denies injury. He has had multiple sprain in this ankle when he was younger. He has been told he has arthritis in this ankle before following an xray.    Past Medical History:  Diagnosis Date   Allergy     Past Surgical History:  Procedure Laterality Date   LUMBAR [place] SURGERY  2021    Family History  Problem Relation Age of Onset   Hypertension Mother    Heart disease Mother    Depression Mother    COPD Mother    Cancer Mother 84       uteran Cancer   Asthma Mother    Anxiety disorder Mother    Hypertension Father    Hyperlipidemia Father    Depression Father    Anxiety disorder Father    Stroke Maternal Grandfather    Atrial fibrillation Maternal Grandfather     Social History   Socioeconomic History   Marital status: Single    Spouse name: Not on file   Number of children: 0   Years of education: 14   Highest education level: Some college, no degree  Occupational History   Occupation:  Cytogeneticist  Tobacco Use   Smoking status: Never   Smokeless tobacco: Current    Types: Snuff  Vaping Use   Vaping Use: Never used  Substance and Sexual Activity   Alcohol use: Yes    Alcohol/week: 6.0 standard drinks    Types: 6 Cans of beer per week   Drug use: Never   Sexual activity: Not Currently  Other Topics Concern   Not on file  Social History Narrative   Not on file   Social Determinants of Health   Financial Resource Strain: Not on file  Food Insecurity: Not on file  Transportation Needs: Not on file  Physical Activity: Not on file  Stress: Not on file  Social Connections: Not on file  Intimate Partner Violence: Not on file    ROS Review of Systems As per HPI.   Objective:   Today's Vitals: BP (!) 159/91   Pulse 97   Temp 98.8 F (37.1 C) (Oral)   Ht 6' (1.829 m)   Wt 259 lb 6 oz (117.7 kg)   BMI 35.18 kg/m   Physical Exam Vitals and nursing note reviewed.  Constitutional:      General: He is not in acute distress.    Appearance: He is not ill-appearing, toxic-appearing or  diaphoretic.  Neck:     Vascular: No carotid bruit.  Cardiovascular:     Rate and Rhythm: Normal rate and regular rhythm.     Heart sounds: Normal heart sounds. No murmur heard. Pulmonary:     Effort: Pulmonary effort is normal.     Breath sounds: Normal breath sounds.  Abdominal:     General: Bowel sounds are normal. There is no distension.     Palpations: Abdomen is soft.     Tenderness: There is no abdominal tenderness. There is no right CVA tenderness, left CVA tenderness, guarding or rebound.  Musculoskeletal:     Cervical back: Neck supple.     Right lower leg: No edema.     Left lower leg: No swelling, tenderness or bony tenderness. No edema.  Skin:    General: Skin is warm and dry.  Neurological:     General: No focal deficit present.     Mental Status: He is alert and oriented to person, place, and time.  Psychiatric:        Mood and Affect: Mood  normal.        Behavior: Behavior normal.    Assessment & Plan:   Rohnan was seen today for new patient (initial visit).  Diagnoses and all orders for this visit:  Primary hypertension Uncontrolled, new diagnosis. Labs pending as below. Start lisinopril as below. Discussed goal BP of 130s/80s, he will start checking BP at home. Discussed diet and exercise.  -     CBC with Differential/Platelet -     CMP14+EGFR -     Lipid panel -     Thyroid Panel With TSH -     lisinopril (ZESTRIL) 10 MG tablet; Take 1 tablet (10 mg total) by mouth daily.  BMI 35.0-35.9,adult Diet and exercise. Labs pending.  -     CBC with Differential/Platelet -     CMP14+EGFR -     Lipid panel -     Thyroid Panel With TSH  Acute left ankle pain Prednisone burst as below. Diclofenac ordered. Do not take other NSAIDs with diclofenac.  -     predniSONE (DELTASONE) 20 MG tablet; Take 2 tablets (40 mg total) by mouth daily with breakfast for 5 days. -     diclofenac (VOLTAREN) 75 MG EC tablet; Take 1 tablet (75 mg total) by mouth 2 (two) times daily.  Encounter for screening for HIV -     HIV antibody (with reflex)  Need for hepatitis C screening test -     Hepatitis C antibody  Need for vaccination Tdap today in office.  -     Tdap vaccine greater than or equal to 7yo IM  Encounter to establish care Reviewed available records.    Follow-up: Return in about 4 weeks (around 09/09/2020) for BP follow up.  The patient indicates understanding of these issues and agrees with the plan.   Gwenlyn Perking, FNP

## 2020-08-13 ENCOUNTER — Encounter: Payer: Self-pay | Admitting: Family Medicine

## 2020-08-13 DIAGNOSIS — I10 Essential (primary) hypertension: Secondary | ICD-10-CM | POA: Insufficient documentation

## 2020-08-13 DIAGNOSIS — Z6835 Body mass index (BMI) 35.0-35.9, adult: Secondary | ICD-10-CM | POA: Insufficient documentation

## 2020-08-13 LAB — THYROID PANEL WITH TSH
Free Thyroxine Index: 1.8 (ref 1.2–4.9)
T3 Uptake Ratio: 28 % (ref 24–39)
T4, Total: 6.6 ug/dL (ref 4.5–12.0)
TSH: 1.87 u[IU]/mL (ref 0.450–4.500)

## 2020-08-13 LAB — CMP14+EGFR
ALT: 30 IU/L (ref 0–44)
AST: 18 IU/L (ref 0–40)
Albumin/Globulin Ratio: 2.3 — ABNORMAL HIGH (ref 1.2–2.2)
Albumin: 4.8 g/dL (ref 4.1–5.2)
Alkaline Phosphatase: 79 IU/L (ref 44–121)
BUN/Creatinine Ratio: 17 (ref 9–20)
BUN: 17 mg/dL (ref 6–20)
Bilirubin Total: 0.4 mg/dL (ref 0.0–1.2)
CO2: 23 mmol/L (ref 20–29)
Calcium: 9.7 mg/dL (ref 8.7–10.2)
Chloride: 101 mmol/L (ref 96–106)
Creatinine, Ser: 0.98 mg/dL (ref 0.76–1.27)
Globulin, Total: 2.1 g/dL (ref 1.5–4.5)
Glucose: 91 mg/dL (ref 65–99)
Potassium: 4.6 mmol/L (ref 3.5–5.2)
Sodium: 142 mmol/L (ref 134–144)
Total Protein: 6.9 g/dL (ref 6.0–8.5)
eGFR: 108 mL/min/{1.73_m2} (ref >59)

## 2020-08-13 LAB — LIPID PANEL
Chol/HDL Ratio: 5.8 ratio — ABNORMAL HIGH (ref 0.0–5.0)
Cholesterol, Total: 216 mg/dL — ABNORMAL HIGH (ref 100–199)
HDL: 37 mg/dL — ABNORMAL LOW (ref >39)
LDL Chol Calc (NIH): 135 mg/dL — ABNORMAL HIGH (ref 0–99)
Triglycerides: 244 mg/dL — ABNORMAL HIGH (ref 0–149)
VLDL Cholesterol Cal: 44 mg/dL — ABNORMAL HIGH (ref 5–40)

## 2020-08-13 LAB — CBC WITH DIFFERENTIAL/PLATELET
Basophils Absolute: 0 10*3/uL (ref 0.0–0.2)
Basos: 0 %
EOS (ABSOLUTE): 0.1 10*3/uL (ref 0.0–0.4)
Eos: 1 %
Hematocrit: 46.7 % (ref 37.5–51.0)
Hemoglobin: 15.8 g/dL (ref 13.0–17.7)
Immature Grans (Abs): 0 10*3/uL (ref 0.0–0.1)
Immature Granulocytes: 1 %
Lymphocytes Absolute: 1 10*3/uL (ref 0.7–3.1)
Lymphs: 17 %
MCH: 30.7 pg (ref 26.6–33.0)
MCHC: 33.8 g/dL (ref 31.5–35.7)
MCV: 91 fL (ref 79–97)
Monocytes Absolute: 0.6 10*3/uL (ref 0.1–0.9)
Monocytes: 10 %
Neutrophils Absolute: 4.2 10*3/uL (ref 1.4–7.0)
Neutrophils: 71 %
Platelets: 240 10*3/uL (ref 150–450)
RBC: 5.14 x10E6/uL (ref 4.14–5.80)
RDW: 13 % (ref 11.6–15.4)
WBC: 5.8 10*3/uL (ref 3.4–10.8)

## 2020-08-13 LAB — HEPATITIS C ANTIBODY: Hep C Virus Ab: <0.1 s/co ratio (ref 0.0–0.9)

## 2020-08-13 LAB — HIV ANTIBODY (ROUTINE TESTING W REFLEX): HIV Screen 4th Generation wRfx: NONREACTIVE

## 2020-09-09 ENCOUNTER — Encounter: Payer: Self-pay | Admitting: Family Medicine

## 2020-09-09 ENCOUNTER — Other Ambulatory Visit: Payer: Self-pay

## 2020-09-09 ENCOUNTER — Ambulatory Visit (INDEPENDENT_AMBULATORY_CARE_PROVIDER_SITE_OTHER): Payer: BC Managed Care – PPO | Admitting: Family Medicine

## 2020-09-09 ENCOUNTER — Ambulatory Visit (INDEPENDENT_AMBULATORY_CARE_PROVIDER_SITE_OTHER): Payer: BC Managed Care – PPO

## 2020-09-09 VITALS — BP 147/85 | HR 92 | Temp 97.2°F | Ht 72.0 in | Wt 259.0 lb

## 2020-09-09 DIAGNOSIS — I10 Essential (primary) hypertension: Secondary | ICD-10-CM

## 2020-09-09 DIAGNOSIS — M25572 Pain in left ankle and joints of left foot: Secondary | ICD-10-CM

## 2020-09-09 DIAGNOSIS — G8929 Other chronic pain: Secondary | ICD-10-CM | POA: Diagnosis not present

## 2020-09-09 MED ORDER — DICLOFENAC SODIUM 75 MG PO TBEC: 75.0000 mg | DELAYED_RELEASE_TABLET | Freq: Two times a day (BID) | ORAL | 3 refills | Status: DC

## 2020-09-09 MED ORDER — LISINOPRIL 20 MG PO TABS: 20.0000 mg | ORAL_TABLET | Freq: Every day | ORAL | 3 refills | Status: DC

## 2020-09-09 NOTE — Progress Notes (Signed)
Established Patient Office Visit  Subjective:  Patient ID: Duane Dixon, male    DOB: [birth year]  Age: 30 y.o. MRN: [id number]  CC:  Chief Complaint  Patient presents with   Hypertension    4 week rck       HPI Duane Dixon presents for HTN follow up.   HTN Complaint with meds - Yes Current Medications - lisinopril 10 mg Checking BP at home ranging 726-203T systolic Pertinent ROS:  Headache - No Fatigue - No Visual Disturbances - No Chest pain - No Dyspnea - No Palpitations - No LE edema - No They report good compliance with medications and can restate their regimen by memory. No medication side effects.  Family, social, and smoking history reviewed.   BP Readings from Last 3 Encounters:  09/09/20 (!) 147/85  08/12/20 (!) 172/88   2. Chronic left ankle pain Pain has improved with prednisone burst and diclofenc. It still bothers him throughout the day but it is now tolerable. Pulled up previous xray from 5 years ago in care everywhere. 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 He is unaware of this and reports he was just told that he had arthritis. Denies injury since incident but that pain has significantly increased in the last 1-2 months.   Past Medical History:  Diagnosis Date   Allergy     Past Surgical History:  Procedure Laterality Date   LUMBAR [place] SURGERY  2021    Family History  Problem Relation Age of Onset   Hypertension Mother    Heart disease Mother    Depression Mother    COPD Mother    Cancer Mother 16       uteran Cancer   Asthma Mother    Anxiety disorder Mother    Hypertension Father    Hyperlipidemia Father    Depression Father    Anxiety disorder Father    Stroke Maternal Grandfather    Atrial fibrillation Maternal Grandfather     Social History   Socioeconomic History   Marital status: Single    Spouse name: Not on file   Number of children: 0   Years of  education: 14   Highest education level: Some college, no degree  Occupational History   Occupation: Cytogeneticist  Tobacco Use   Smoking status: Never   Smokeless tobacco: Current    Types: Snuff  Vaping Use   Vaping Use: Never used  Substance and Sexual Activity   Alcohol use: Yes    Alcohol/week: 6.0 standard drinks    Types: 6 Cans of beer per week   Drug use: Never   Sexual activity: Not Currently  Other Topics Concern   Not on file  Social History Narrative   Not on file   Social Determinants of Health   Financial Resource Strain: Not on file  Food Insecurity: Not on file  Transportation Needs: Not on file  Physical Activity: Not on file  Stress: Not on file  Social Connections: Not on file  Intimate Partner Violence: Not on file    Outpatient Medications Prior to Visit  Medication Sig Dispense Refill   acetaminophen (TYLENOL) 500 MG tablet Take 500 mg by mouth every 6 (six) hours as needed.     diclofenac (VOLTAREN) 75 MG EC tablet Take 1 tablet (75 mg total) by mouth 2 (two) times daily. 30 tablet 1   lisinopril (ZESTRIL) 10 MG tablet Take 1 tablet (10  mg total) by mouth daily. 30 tablet 3   ibuprofen (ADVIL) 200 MG tablet Take 600 mg by mouth every 6 (six) hours as needed. (Patient not taking: Reported on 09/09/2020)     No facility-administered medications prior to visit.    Allergies  Allergen Reactions   Codeine Other (See Comments)    Intensifies pain    ROS Review of Systems As per HPI.    Objective:    Physical Exam Vitals and nursing note reviewed.  Constitutional:      General: He is not in acute distress.    Appearance: He is not ill-appearing, toxic-appearing or diaphoretic.  Cardiovascular:     Rate and Rhythm: Normal rate and regular rhythm.     Heart sounds: Normal heart sounds. No murmur heard. Pulmonary:     Effort: Pulmonary effort is normal. No respiratory distress.     Breath sounds: Normal breath sounds.  Skin:     General: Skin is warm and dry.  Neurological:     General: No focal deficit present.     Mental Status: He is alert and oriented to person, place, and time.  Psychiatric:        Mood and Affect: Mood normal.        Behavior: Behavior normal.    BP (!) 147/85   Pulse 92   Temp (!) 97.2 F (36.2 C) (Temporal)   Ht 6' (1.829 m)   Wt 259 lb (117.5 kg)   SpO2 97%   BMI 35.13 kg/m  Wt Readings from Last 3 Encounters:  09/09/20 259 lb (117.5 kg)  08/12/20 259 lb 6 oz (117.7 kg)     There are no preventive care reminders to display for this patient.  There are no preventive care reminders to display for this patient.  Lab Results  Component Value Date   TSH 1.870 08/12/2020   Lab Results  Component Value Date   WBC 5.8 08/12/2020   HGB 15.8 08/12/2020   HCT 46.7 08/12/2020   MCV 91 08/12/2020   PLT 240 08/12/2020   Lab Results  Component Value Date   NA 142 08/12/2020   K 4.6 08/12/2020   CO2 23 08/12/2020   GLUCOSE 91 08/12/2020   BUN 17 08/12/2020   CREATININE 0.98 08/12/2020   BILITOT 0.4 08/12/2020   ALKPHOS 79 08/12/2020   AST 18 08/12/2020   ALT 30 08/12/2020   PROT 6.9 08/12/2020   ALBUMIN 4.8 08/12/2020   CALCIUM 9.7 08/12/2020   EGFR 108 08/12/2020   Lab Results  Component Value Date   CHOL 216 (H) 08/12/2020   Lab Results  Component Value Date   HDL 37 (L) 08/12/2020   Lab Results  Component Value Date   LDLCALC 135 (H) 08/12/2020   Lab Results  Component Value Date   TRIG 244 (H) 08/12/2020   Lab Results  Component Value Date   CHOLHDL 5.8 (H) 08/12/2020   No results found for: HGBA1C    Assessment & Plan:   Duane Dixon was seen today for hypertension.  Diagnoses and all orders for this visit:  Primary hypertension Improving, but not quite at goal. Increase lisinopril to 20 mg daily. Monitor BP at home, notify for persistent BPs >=140s/90s. Follow up in 2 months, sooner if needed.  -     lisinopril (ZESTRIL) 20 MG tablet; Take 1  tablet (20 mg total) by mouth daily.  Chronic pain of left ankle Repeat xray today, radiology report pending. Continue voltaren as needed.  Referral placed to ortho given previous Xray report of deformity with separate bony fragments.  -     Ambulatory referral to Orthopedic Surgery -     DG Ankle Complete Left; Future -     diclofenac (VOLTAREN) 75 MG EC tablet; Take 1 tablet (75 mg total) by mouth 2 (two) times daily.   Follow-up: Return in about 2 months (around 11/10/2020) for chronic follow up.  The patient indicates understanding of these issues and agrees with the plan.    Gwenlyn Perking, FNP

## 2020-09-09 NOTE — Patient Instructions (Signed)

## 2020-11-10 ENCOUNTER — Ambulatory Visit (INDEPENDENT_AMBULATORY_CARE_PROVIDER_SITE_OTHER): Payer: BC Managed Care – PPO | Admitting: Family Medicine

## 2020-11-10 ENCOUNTER — Encounter: Payer: Self-pay | Admitting: Family Medicine

## 2020-11-10 ENCOUNTER — Other Ambulatory Visit: Payer: Self-pay

## 2020-11-10 VITALS — BP 140/70 | HR 93 | Temp 97.6°F | Ht 72.0 in | Wt 258.1 lb

## 2020-11-10 DIAGNOSIS — E782 Mixed hyperlipidemia: Secondary | ICD-10-CM

## 2020-11-10 DIAGNOSIS — G8929 Other chronic pain: Secondary | ICD-10-CM

## 2020-11-10 DIAGNOSIS — I10 Essential (primary) hypertension: Secondary | ICD-10-CM | POA: Diagnosis not present

## 2020-11-10 DIAGNOSIS — M25572 Pain in left ankle and joints of left foot: Secondary | ICD-10-CM

## 2020-11-10 MED ORDER — LISINOPRIL 10 MG PO TABS: 10.0000 mg | ORAL_TABLET | Freq: Every day | ORAL | 3 refills | Status: DC

## 2020-11-10 MED ORDER — DICLOFENAC SODIUM 75 MG PO TBEC: 75.0000 mg | DELAYED_RELEASE_TABLET | Freq: Two times a day (BID) | ORAL | 0 refills | Status: AC

## 2020-11-10 NOTE — Progress Notes (Signed)
Established Patient Office Visit  Subjective:  Patient ID: Duane Dixon, male    DOB: Feb 15, 1990  Age: 30 y.o. MRN: [id number]  CC:  Chief Complaint  Patient presents with   Hypertension    HPI Petro Talent presents for chronic follow up.   HTN Complaint with meds - Yes Current Medications - lisinopril 20 mg  Checking BP at home ranging: 140s/90s Exercising Regularly - active at work Diet - has decreased portion sizes, weight has been stable Pertinent ROS:  Headache - No Fatigue - No Visual Disturbances - No Chest pain - No Dyspnea - No Palpitations - No LE edema - No They report good compliance with medications and can restate their regimen by memory. No medication side effects.  Family, social, and smoking history reviewed.   He needs a refill on Voltaren for his ankle today. This has been helpful for his chronic ankle pain.   BP Readings from Last 3 Encounters:  11/10/20 140/70  09/09/20 (!) 147/85  08/12/20 (!) 172/88   CMP Latest Ref Rng & Units 08/12/2020  Glucose 65 - 99 mg/dL 91  BUN 6 - 20 mg/dL 17  Creatinine 0.76 - 1.27 mg/dL 0.98  Sodium 134 - 144 mmol/L 142  Potassium 3.5 - 5.2 mmol/L 4.6  Chloride 96 - 106 mmol/L 101  CO2 20 - 29 mmol/L 23  Calcium 8.7 - 10.2 mg/dL 9.7  Total Protein 6.0 - 8.5 g/dL 6.9  Total Bilirubin 0.0 - 1.2 mg/dL 0.4  Alkaline Phos 44 - 121 IU/L 79  AST 0 - 40 IU/L 18  ALT 0 - 44 IU/L 30      Past Medical History:  Diagnosis Date   Allergy     Past Surgical History:  Procedure Laterality Date   LUMBAR DISC SURGERY  2021    Family History  Problem Relation Age of Onset   Hypertension Mother    Heart disease Mother    Depression Mother    COPD Mother    Cancer Mother 48       uteran Cancer   Asthma Mother    Anxiety disorder Mother    Hypertension Father    Hyperlipidemia Father    Depression Father    Anxiety disorder Father    Stroke Maternal Grandfather    Atrial fibrillation Maternal Grandfather      Social History   Socioeconomic History   Marital status: Single    Spouse name: Not on file   Number of children: 0   Years of education: 14   Highest education level: Some college, no degree  Occupational History   Occupation: Cytogeneticist  Tobacco Use   Smoking status: Never   Smokeless tobacco: Current    Types: Snuff  Vaping Use   Vaping Use: Never used  Substance and Sexual Activity   Alcohol use: Yes    Alcohol/week: 6.0 standard drinks    Types: 6 Cans of beer per week   Drug use: Never   Sexual activity: Not Currently  Other Topics Concern   Not on file  Social History Narrative   Not on file   Social Determinants of Health   Financial Resource Strain: Not on file  Food Insecurity: Not on file  Transportation Needs: Not on file  Physical Activity: Not on file  Stress: Not on file  Social Connections: Not on file  Intimate Partner Violence: Not on file    Outpatient Medications Prior to Visit  Medication Sig Dispense Refill  acetaminophen (TYLENOL) 500 MG tablet Take 500 mg by mouth every 6 (six) hours as needed.     diclofenac (VOLTAREN) 75 MG EC tablet Take 1 tablet (75 mg total) by mouth 2 (two) times daily. 30 tablet 3   lisinopril (ZESTRIL) 20 MG tablet Take 1 tablet (20 mg total) by mouth daily. 90 tablet 3   ibuprofen (ADVIL) 200 MG tablet Take 600 mg by mouth every 6 (six) hours as needed. (Patient not taking: No sig reported)     No facility-administered medications prior to visit.    Allergies  Allergen Reactions   Codeine Other (See Comments)    Intensifies pain    ROS Review of Systems As per HPI.    Objective:    Physical Exam Vitals and nursing note reviewed.  Constitutional:      General: He is not in acute distress.    Appearance: He is not ill-appearing, toxic-appearing or diaphoretic.  Cardiovascular:     Rate and Rhythm: Normal rate and regular rhythm.     Heart sounds: Normal heart sounds. No murmur  heard. Pulmonary:     Effort: Pulmonary effort is normal. No respiratory distress.     Breath sounds: Normal breath sounds.  Musculoskeletal:     Right lower leg: No edema.     Left lower leg: No edema.  Skin:    General: Skin is warm and dry.  Neurological:     General: No focal deficit present.     Mental Status: He is alert and oriented to person, place, and time.  Psychiatric:        Mood and Affect: Mood normal.        Behavior: Behavior normal.    BP 140/70   Pulse 93   Temp 97.6 F (36.4 C) (Temporal)   Ht 6' (1.829 m)   Wt 258 lb 2 oz (117.1 kg)   BMI 35.01 kg/m  Wt Readings from Last 3 Encounters:  11/10/20 258 lb 2 oz (117.1 kg)  09/09/20 259 lb (117.5 kg)  08/12/20 259 lb 6 oz (117.7 kg)     There are no preventive care reminders to display for this patient.  There are no preventive care reminders to display for this patient.  Lab Results  Component Value Date   TSH 1.870 08/12/2020   Lab Results  Component Value Date   WBC 5.8 08/12/2020   HGB 15.8 08/12/2020   HCT 46.7 08/12/2020   MCV 91 08/12/2020   PLT 240 08/12/2020   Lab Results  Component Value Date   NA 142 08/12/2020   K 4.6 08/12/2020   CO2 23 08/12/2020   GLUCOSE 91 08/12/2020   BUN 17 08/12/2020   CREATININE 0.98 08/12/2020   BILITOT 0.4 08/12/2020   ALKPHOS 79 08/12/2020   AST 18 08/12/2020   ALT 30 08/12/2020   PROT 6.9 08/12/2020   ALBUMIN 4.8 08/12/2020   CALCIUM 9.7 08/12/2020   EGFR 108 08/12/2020   Lab Results  Component Value Date   CHOL 216 (H) 08/12/2020   Lab Results  Component Value Date   HDL 37 (L) 08/12/2020   Lab Results  Component Value Date   LDLCALC 135 (H) 08/12/2020   Lab Results  Component Value Date   TRIG 244 (H) 08/12/2020   Lab Results  Component Value Date   CHOLHDL 5.8 (H) 08/12/2020   No results found for: HGBA1C    Assessment & Plan:   Dimitry was seen today for hypertension.  Diagnoses and all orders for this  visit:  Primary hypertension Not quite at goal. Increase lisinopril to 30 mg daily, insurance will not cover higher dosage tablet. Instructed patient to take one 20 mg tablet and one 10 mg tablet daily for a total of 30 mg daily. Monitor BP at home.  -     lisinopril (ZESTRIL) 10 MG tablet; Take 1 tablet (10 mg total) by mouth daily.  Mixed hyperlipidemia Diet and exercise. Will recheck labs at next visit.   Morbid obesity (HCC) BMI 35 with HLD and HTN. Diet and exercise.  Chronic pain of left ankle Well controlled on current regimen. Refill provided.  -     diclofenac (VOLTAREN) 75 MG EC tablet; Take 1 tablet (75 mg total) by mouth 2 (two) times daily.  Follow-up: Return in about 12 weeks (around 02/02/2021) for chronic follow up.  The patient indicates understanding of these issues and agrees with the plan.    Gwenlyn Perking, FNP

## 2021-07-18 ENCOUNTER — Other Ambulatory Visit: Payer: Self-pay | Admitting: Family Medicine

## 2021-07-18 DIAGNOSIS — M25572 Pain in left ankle and joints of left foot: Secondary | ICD-10-CM

## 2021-08-15 ENCOUNTER — Other Ambulatory Visit: Payer: Self-pay | Admitting: Family Medicine

## 2021-08-15 DIAGNOSIS — I10 Essential (primary) hypertension: Secondary | ICD-10-CM

## 2021-08-15 DIAGNOSIS — M25572 Pain in left ankle and joints of left foot: Secondary | ICD-10-CM

## 2021-09-05 NOTE — Telephone Encounter (Signed)
Pt scheduled apt for 10/14/2021 Needs refill on lisinopril (ZESTRIL) 20 MG tablet diclofenac (VOLTAREN) 75 MG EC tablet Pt cannot get off work till [employer].

## 2021-09-06 MED ORDER — LISINOPRIL 20 MG PO TABS: 20.0000 mg | ORAL_TABLET | Freq: Every day | ORAL | 0 refills | Status: DC

## 2021-09-06 MED ORDER — LISINOPRIL 10 MG PO TABS: 10.0000 mg | ORAL_TABLET | Freq: Every day | ORAL | 0 refills | Status: DC

## 2021-09-06 NOTE — Addendum Note (Signed)
Addended by: Julious Payer D on: 09/06/2021 11:24 AM   Modules accepted: Orders

## 2021-09-07 MED ORDER — DICLOFENAC SODIUM 75 MG PO TBEC: 75.0000 mg | DELAYED_RELEASE_TABLET | Freq: Two times a day (BID) | ORAL | 1 refills | Status: DC

## 2021-10-14 ENCOUNTER — Encounter: Payer: Self-pay | Admitting: Family Medicine

## 2021-10-14 ENCOUNTER — Ambulatory Visit (INDEPENDENT_AMBULATORY_CARE_PROVIDER_SITE_OTHER): Payer: BC Managed Care – PPO | Admitting: Family Medicine

## 2021-10-14 VITALS — BP 133/75 | HR 98 | Temp 98.9°F | Ht 72.0 in | Wt 273.0 lb

## 2021-10-14 DIAGNOSIS — G4700 Insomnia, unspecified: Secondary | ICD-10-CM

## 2021-10-14 DIAGNOSIS — G8929 Other chronic pain: Secondary | ICD-10-CM

## 2021-10-14 DIAGNOSIS — I10 Essential (primary) hypertension: Secondary | ICD-10-CM

## 2021-10-14 DIAGNOSIS — F411 Generalized anxiety disorder: Secondary | ICD-10-CM | POA: Diagnosis not present

## 2021-10-14 DIAGNOSIS — E782 Mixed hyperlipidemia: Secondary | ICD-10-CM | POA: Diagnosis not present

## 2021-10-14 DIAGNOSIS — M25572 Pain in left ankle and joints of left foot: Secondary | ICD-10-CM

## 2021-10-14 MED ORDER — LISINOPRIL 10 MG PO TABS: 10.0000 mg | ORAL_TABLET | Freq: Every day | ORAL | 1 refills | Status: DC

## 2021-10-14 MED ORDER — DICLOFENAC SODIUM 75 MG PO TBEC: 75.0000 mg | DELAYED_RELEASE_TABLET | Freq: Two times a day (BID) | ORAL | 1 refills | Status: DC

## 2021-10-14 MED ORDER — FLUOXETINE HCL 10 MG PO CAPS: 10.0000 mg | ORAL_CAPSULE | Freq: Every day | ORAL | 3 refills | Status: DC

## 2021-10-14 MED ORDER — LISINOPRIL 20 MG PO TABS: 20.0000 mg | ORAL_TABLET | Freq: Every day | ORAL | 1 refills | Status: DC

## 2021-10-14 NOTE — Patient Instructions (Signed)
Unisom for sleep  Insomnia Insomnia is a sleep disorder that makes it difficult to fall asleep or stay asleep. Insomnia can cause fatigue, low energy, difficulty concentrating, mood swings, and poor performance at work or school. There are three different ways to classify insomnia: Difficulty falling asleep. Difficulty staying asleep. Waking up too early in the morning. Any type of insomnia can be long-term (chronic) or short-term (acute). Both are common. Short-term insomnia usually lasts for 3 months or less. Chronic insomnia occurs at least three times a week for longer than 3 months. What are the causes? Insomnia may be caused by another condition, situation, or substance, such as: Having certain mental health conditions, such as anxiety and depression. Using caffeine, alcohol, tobacco, or drugs. Having gastrointestinal conditions, such as gastroesophageal reflux disease (GERD). Having certain medical conditions. These include: Asthma. Alzheimer's disease. Stroke. Chronic pain. An overactive thyroid gland (hyperthyroidism). Other sleep disorders, such as restless legs syndrome and sleep apnea. Menopause. Sometimes, the cause of insomnia may not be known. What increases the risk? Risk factors for insomnia include: Gender. Females are affected more often than males. Age. Insomnia is more common as people get older. Stress and certain medical and mental health conditions. Lack of exercise. Having an irregular work schedule. This may include working night shifts and traveling between different time zones. What are the signs or symptoms? If you have insomnia, the main symptom is having trouble falling asleep or having trouble staying asleep. This may lead to other symptoms, such as: Feeling tired or having low energy. Feeling nervous about going to sleep. Not feeling rested in the morning. Having trouble concentrating. Feeling irritable, anxious, or depressed. How is this  diagnosed? This condition may be diagnosed based on: Your symptoms and medical history. Your health care provider may ask about: Your sleep habits. Any medical conditions you have. Your mental health. A physical exam. How is this treated? Treatment for insomnia depends on the cause. Treatment may focus on treating an underlying condition that is causing the insomnia. Treatment may also include: Medicines to help you sleep. Counseling or therapy. Lifestyle adjustments to help you sleep better. Follow these instructions at home: Eating and drinking  Limit or avoid alcohol, caffeinated beverages, and products that contain nicotine and tobacco, especially close to bedtime. These can disrupt your sleep. Do not eat a large meal or eat spicy foods right before bedtime. This can lead to digestive discomfort that can make it hard for you to sleep. Sleep habits  Keep a sleep diary to help you and your health care provider figure out what could be causing your insomnia. Write down: When you sleep. When you wake up during the night. How well you sleep and how rested you feel the next day. Any side effects of medicines you are taking. What you eat and drink. Make your bedroom a dark, comfortable place where it is easy to fall asleep. Put up shades or blackout curtains to block light from outside. Use a white noise machine to block noise. Keep the temperature cool. Limit screen use before bedtime. This includes: Not watching TV. Not using your smartphone, tablet, or computer. Stick to a routine that includes going to bed and waking up at the same times every day and night. This can help you fall asleep faster. Consider making a quiet activity, such as reading, part of your nighttime routine. Try to avoid taking naps during the day so that you sleep better at night. Get out of bed if you  are still awake after 15 minutes of trying to sleep. Keep the lights down, but try reading or doing a quiet  activity. When you feel sleepy, go back to bed. General instructions Take over-the-counter and prescription medicines only as told by your health care provider. Exercise regularly as told by your health care provider. However, avoid exercising in the hours right before bedtime. Use relaxation techniques to manage stress. Ask your health care provider to suggest some techniques that may work well for you. These may include: Breathing exercises. Routines to release muscle tension. Visualizing peaceful scenes. Make sure that you drive carefully. Do not drive if you feel very sleepy. Keep all follow-up visits. This is important. Contact a health care provider if: You are tired throughout the day. You have trouble in your daily routine due to sleepiness. You continue to have sleep problems, or your sleep problems get worse. Get help right away if: You have thoughts about hurting yourself or someone else. Get help right away if you feel like you may hurt yourself or others, or have thoughts about taking your own life. Go to your nearest emergency room or: Call 911. Call the National Suicide Prevention Lifeline at 845-815-8609 or 988. This is open 24 hours a day. Text the Crisis Text Line at 803-268-0558. Summary Insomnia is a sleep disorder that makes it difficult to fall asleep or stay asleep. Insomnia can be long-term (chronic) or short-term (acute). Treatment for insomnia depends on the cause. Treatment may focus on treating an underlying condition that is causing the insomnia. Keep a sleep diary to help you and your health care provider figure out what could be causing your insomnia. This information is not intended to replace advice given to you by your health care provider. Make sure you discuss any questions you have with your health care provider. Document Revised: 01/10/2021 Document Reviewed: 01/10/2021 Elsevier Patient Education  2023 [employer].

## 2021-10-14 NOTE — Progress Notes (Signed)
Established Patient Office Visit  Subjective   Patient ID: Duane Dixon, male    DOB: [birth year]  Age: 31 y.o. MRN: [id number]  Chief Complaint  Patient presents with   Medical Management of Chronic Issues   Hypertension    Hypertension This is a new problem. The current episode started more than 1 year ago. The problem is controlled. Associated symptoms include anxiety. Pertinent negatives include no blurred vision, chest pain, headaches, malaise/fatigue, neck pain, orthopnea, palpitations, peripheral edema, PND, shortness of breath or sweats. Agents associated with hypertension include NSAIDs. Risk factors for coronary artery disease include dyslipidemia, male gender and obesity. Past treatments include ACE inhibitors. Compliance problems include diet and exercise.  There is no history of angina, kidney disease, CAD/MI, CVA, heart failure, left ventricular hypertrophy, PVD or retinopathy.   He is not fasting today for labs.   He has a history of anxiety and feels like it has been worse recently. He has never tried medication for anxiety but would like to try something. He reports that it is generalized anxiety but there is a social component as well. He also has a long history of intermittent difficulty sleeping. He has tried melatonin without relief. He reports that it is mainly difficulty falling asleep and then he had difficulty controlling his thoughts. He has not tried any other medications.   He reports that diclofenac has worked well for his chronic ankle pain. He has minimal pain now.      10/14/2021    4:14 PM 09/09/2020    4:28 PM 08/12/2020    3:50 PM  Depression screen PHQ 2/9  Decreased Interest 1 0 0  Down, Depressed, Hopeless 1 0 0  PHQ - 2 Score 2 0 0  Altered sleeping '1 1 1  ' Tired, decreased energy 0 1 1  Change in appetite 0 0 0  Feeling bad or failure about yourself  0 0 0  Trouble concentrating 0 0 0  Moving slowly or fidgety/restless 0 0 0  Suicidal thoughts 0  0 0  PHQ-9 Score '3 2 2  ' Difficult doing work/chores Not difficult at all Not difficult at all Somewhat difficult      10/14/2021    4:14 PM 08/12/2020    3:51 PM  GAD 7 : Generalized Anxiety Score  Nervous, Anxious, on Edge 1 0  Control/stop worrying 1 0  Worry too much - different things 1 0  Trouble relaxing 1 1  Restless 0 0  Easily annoyed or irritable 0 1  Afraid - awful might happen 1 0  Total GAD 7 Score 5 2  Anxiety Difficulty Not difficult at all Not difficult at all    Review of Systems  Constitutional:  Negative for malaise/fatigue.  Eyes:  Negative for blurred vision.  Respiratory:  Negative for shortness of breath.   Cardiovascular:  Negative for chest pain, palpitations, orthopnea and PND.  Musculoskeletal:  Negative for neck pain.  Neurological:  Negative for headaches.      Objective:     BP 133/75   Pulse 98   Temp 98.9 F (37.2 C) (Temporal)   Ht 6' (1.829 m)   Wt 273 lb (123.8 kg)   SpO2 99%   BMI 37.03 kg/m    Physical Exam Vitals and nursing note reviewed.  Constitutional:      General: He is not in acute distress.    Appearance: He is not ill-appearing, toxic-appearing or diaphoretic.  HENT:     Head: Normocephalic and  atraumatic.  Eyes:     Extraocular Movements: Extraocular movements intact.     Pupils: Pupils are equal, round, and reactive to light.  Neck:     Thyroid: No thyroid mass, thyromegaly or thyroid tenderness.     Vascular: No carotid bruit or JVD.  Cardiovascular:     Rate and Rhythm: Normal rate and regular rhythm.     Heart sounds: Normal heart sounds. No murmur heard. Pulmonary:     Effort: Pulmonary effort is normal. No respiratory distress.     Breath sounds: Normal breath sounds.  Abdominal:     General: Bowel sounds are normal. There is no distension.     Palpations: Abdomen is soft.     Tenderness: There is no abdominal tenderness. There is no guarding or rebound.  Musculoskeletal:     Right lower leg: No  edema.     Left lower leg: No edema.  Skin:    General: Skin is warm and dry.  Neurological:     General: No focal deficit present.     Mental Status: He is alert and oriented to person, place, and time.     Motor: No weakness.  Psychiatric:        Mood and Affect: Mood normal.        Behavior: Behavior normal.      No results found for any visits on 10/14/21.    The ASCVD Risk score (Arnett DK, et al., 2019) failed to calculate for the following reasons:   The 2019 ASCVD risk score is only valid for ages 67 to 52    Assessment & Plan:   Demeco was seen today for medical management of chronic issues and hypertension.  Diagnoses and all orders for this visit:  Primary hypertension Well controlled on current regimen.  He will return for fasting labs.  -     lisinopril (ZESTRIL) 10 MG tablet; Take 1 tablet (10 mg total) by mouth daily. Take with a 20 mg daily (total 30 mg) -     lisinopril (ZESTRIL) 20 MG tablet; Take 1 tablet (20 mg total) by mouth daily. Take with a 10 mg daily (total 30 mg) -     CMP14+EGFR; Future -     CBC with Differential/Platelet; Future -     Lipid panel; Future -     TSH; Future  Mixed hyperlipidemia He will return for fasting labs. Diet and exercise.  -     Lipid panel; Future  Morbid obesity (HCC) BMI of 35 with HTN and HLD. Diet and exercise.  Generalized anxiety disorder Will try prozac as below. Follow up in 6 weeks.  -     FLUoxetine (PROZAC) 10 MG capsule; Take 1 capsule (10 mg total) by mouth daily.  Insomnia Hopefully will improve with treatment of anxiety. Discussed unisom OTC prn. Handout given for sleep hygiene.   Chronic left ankle pain Well controlled on current regimen.  -     diclofenac (VOLTAREN) 75 MG EC tablet; Take 1 tablet (75 mg total) by mouth 2 (two) times daily.   Return in about 6 weeks (around 11/25/2021) for medication follow up.   The patient indicates understanding of these issues and agrees with the  plan.  Gwenlyn Perking, FNP

## 2021-11-22 ENCOUNTER — Other Ambulatory Visit: Payer: BC Managed Care – PPO

## 2021-11-22 DIAGNOSIS — E782 Mixed hyperlipidemia: Secondary | ICD-10-CM

## 2021-11-22 DIAGNOSIS — I10 Essential (primary) hypertension: Secondary | ICD-10-CM

## 2021-11-23 ENCOUNTER — Other Ambulatory Visit: Payer: Self-pay | Admitting: Family Medicine

## 2021-11-23 DIAGNOSIS — E782 Mixed hyperlipidemia: Secondary | ICD-10-CM

## 2021-11-23 LAB — CBC WITH DIFFERENTIAL/PLATELET
Basophils Absolute: 0 10*3/uL (ref 0.0–0.2)
Basos: 1 %
EOS (ABSOLUTE): 0.1 10*3/uL (ref 0.0–0.4)
Eos: 2 %
Hematocrit: 42.7 % (ref 37.5–51.0)
Hemoglobin: 14.6 g/dL (ref 13.0–17.7)
Immature Grans (Abs): 0.1 10*3/uL (ref 0.0–0.1)
Immature Granulocytes: 1 %
Lymphocytes Absolute: 1.3 10*3/uL (ref 0.7–3.1)
Lymphs: 23 %
MCH: 31.1 pg (ref 26.6–33.0)
MCHC: 34.2 g/dL (ref 31.5–35.7)
MCV: 91 fL (ref 79–97)
Monocytes Absolute: 0.8 10*3/uL (ref 0.1–0.9)
Monocytes: 13 %
Neutrophils Absolute: 3.6 10*3/uL (ref 1.4–7.0)
Neutrophils: 60 %
Platelets: 244 10*3/uL (ref 150–450)
RBC: 4.69 x10E6/uL (ref 4.14–5.80)
RDW: 12.4 % (ref 11.6–15.4)
WBC: 5.9 10*3/uL (ref 3.4–10.8)

## 2021-11-23 LAB — CMP14+EGFR
ALT: 33 IU/L (ref 0–44)
AST: 18 IU/L (ref 0–40)
Albumin/Globulin Ratio: 2.1 (ref 1.2–2.2)
Albumin: 4.5 g/dL (ref 4.3–5.2)
Alkaline Phosphatase: 69 IU/L (ref 44–121)
BUN/Creatinine Ratio: 17 (ref 9–20)
BUN: 19 mg/dL (ref 6–20)
Bilirubin Total: 0.5 mg/dL (ref 0.0–1.2)
CO2: 19 mmol/L — ABNORMAL LOW (ref 20–29)
Calcium: 9.2 mg/dL (ref 8.7–10.2)
Chloride: 103 mmol/L (ref 96–106)
Creatinine, Ser: 1.1 mg/dL (ref 0.76–1.27)
Globulin, Total: 2.1 g/dL (ref 1.5–4.5)
Glucose: 94 mg/dL (ref 70–99)
Potassium: 4.6 mmol/L (ref 3.5–5.2)
Sodium: 139 mmol/L (ref 134–144)
Total Protein: 6.6 g/dL (ref 6.0–8.5)
eGFR: 93 mL/min/{1.73_m2} (ref >59)

## 2021-11-23 LAB — LIPID PANEL
Chol/HDL Ratio: 7.6 ratio — ABNORMAL HIGH (ref 0.0–5.0)
Cholesterol, Total: 227 mg/dL — ABNORMAL HIGH (ref 100–199)
HDL: 30 mg/dL — ABNORMAL LOW (ref >39)
LDL Chol Calc (NIH): 119 mg/dL — ABNORMAL HIGH (ref 0–99)
Triglycerides: 441 mg/dL — ABNORMAL HIGH (ref 0–149)
VLDL Cholesterol Cal: 78 mg/dL — ABNORMAL HIGH (ref 5–40)

## 2021-11-23 LAB — TSH: TSH: 3.34 u[IU]/mL (ref 0.450–4.500)

## 2021-11-23 MED ORDER — ROSUVASTATIN CALCIUM 5 MG PO TABS: 5.0000 mg | ORAL_TABLET | Freq: Every day | ORAL | 1 refills | Status: DC

## 2021-11-25 ENCOUNTER — Ambulatory Visit (INDEPENDENT_AMBULATORY_CARE_PROVIDER_SITE_OTHER): Payer: BC Managed Care – PPO | Admitting: Family Medicine

## 2021-11-25 ENCOUNTER — Encounter: Payer: Self-pay | Admitting: Family Medicine

## 2021-11-25 VITALS — BP 138/73 | HR 96 | Temp 98.8°F | Ht 72.0 in | Wt 272.5 lb

## 2021-11-25 DIAGNOSIS — F5104 Psychophysiologic insomnia: Secondary | ICD-10-CM

## 2021-11-25 DIAGNOSIS — I10 Essential (primary) hypertension: Secondary | ICD-10-CM | POA: Diagnosis not present

## 2021-11-25 DIAGNOSIS — M109 Gout, unspecified: Secondary | ICD-10-CM

## 2021-11-25 DIAGNOSIS — F411 Generalized anxiety disorder: Secondary | ICD-10-CM | POA: Diagnosis not present

## 2021-11-25 DIAGNOSIS — E782 Mixed hyperlipidemia: Secondary | ICD-10-CM

## 2021-11-25 MED ORDER — FLUOXETINE HCL 20 MG PO CAPS: 20.0000 mg | ORAL_CAPSULE | Freq: Every day | ORAL | 3 refills | Status: DC

## 2021-11-25 MED ORDER — TRAZODONE HCL 50 MG PO TABS: 25.0000 mg | ORAL_TABLET | Freq: Every evening | ORAL | 3 refills | Status: DC | PRN

## 2021-11-25 NOTE — Progress Notes (Signed)
Established Patient Office Visit  Subjective   Patient ID: Duane Dixon, male    DOB: 1990-03-14  Age: 31 y.o. MRN: [id number]  Chief Complaint  Patient presents with   Duane Dixon is here for a follow up of anxiety after starting on prozac 10 mg 6 weeks ago. He does feel like this has been a little helpful. It seems like it has taken the edge off.   He continues to have difficulty falling asleep. This is unchanged since starting prozac. It can take him an hour or more to fall asleep. Once he is asleep, he sleep well. He sleeps in a cool, dark room with white noise. He tries to have decreased stimulation prior to bedtime and keeps a consistent routine. He has tried melatonin and unisom without improvement.   He has not started crestor, but does plan to. He does eat a generally healthy diet.   He reports an episode of sudden pain, redness, and swelling of his right great toe over the weekend. There was not injury and this has now self resolved. He wonders if this was a gout attack. There is a family history of gout.      11/25/2021    3:14 PM 10/14/2021    4:14 PM 09/09/2020    4:28 PM  Depression screen PHQ 2/9  Decreased Interest 1 1 0  Down, Depressed, Hopeless 1 1 0  PHQ - 2 Score 2 2 0  Altered sleeping '2 1 1  ' Tired, decreased energy 1 0 1  Change in appetite 0 0 0  Feeling bad or failure about yourself  0 0 0  Trouble concentrating 0 0 0  Moving slowly or fidgety/restless 0 0 0  Suicidal thoughts 0 0 0  PHQ-9 Score '5 3 2  ' Difficult doing work/chores Somewhat difficult Not difficult at all Not difficult at all      11/25/2021    3:15 PM 10/14/2021    4:14 PM 08/12/2020    3:51 PM  GAD 7 : Generalized Anxiety Score  Nervous, Anxious, on Edge 1 1 0  Control/stop worrying 1 1 0  Worry too much - different things 1 1 0  Trouble relaxing '1 1 1  ' Restless 0 0 0  Easily annoyed or irritable 0 0 1  Afraid - awful might happen 1 1 0  Total GAD 7 Score '5  5 2  ' Anxiety Difficulty Somewhat difficult Not difficult at all Not difficult at all      Past Medical History:  Diagnosis Date   Allergy       ROS Negative unless specially indicated above in HPI.   Objective:     BP 138/73   Pulse 96   Temp 98.8 F (37.1 C) (Temporal)   Ht 6' (1.829 m)   Wt 272 lb 8 oz (123.6 kg)   SpO2 98%   BMI 36.96 kg/m  BP Readings from Last 3 Encounters:  11/25/21 138/73  10/14/21 133/75  11/10/20 140/70      Physical Exam Vitals and nursing note reviewed.  Constitutional:      General: He is not in acute distress.    Appearance: He is not ill-appearing, toxic-appearing or diaphoretic.  HENT:     Head: Normocephalic and atraumatic.  Cardiovascular:     Rate and Rhythm: Normal rate and regular rhythm.     Heart sounds: Normal heart sounds. No murmur heard. Pulmonary:     Effort:  Pulmonary effort is normal. No respiratory distress.  Musculoskeletal:     Right lower leg: No edema.     Left lower leg: No edema.  Skin:    General: Skin is warm and dry.  Neurological:     General: No focal deficit present.     Mental Status: He is alert and oriented to person, place, and time.  Psychiatric:        Mood and Affect: Mood normal.        Behavior: Behavior normal.     No results found for any visits on 11/25/21.  Last CBC Lab Results  Component Value Date   WBC 5.9 11/22/2021   HGB 14.6 11/22/2021   HCT 42.7 11/22/2021   MCV 91 11/22/2021   MCH 31.1 11/22/2021   RDW 12.4 11/22/2021   PLT 244 40/34/7425   Last metabolic panel Lab Results  Component Value Date   GLUCOSE 94 11/22/2021   NA 139 11/22/2021   K 4.6 11/22/2021   CL 103 11/22/2021   CO2 19 (L) 11/22/2021   BUN 19 11/22/2021   CREATININE 1.10 11/22/2021   EGFR 93 11/22/2021   CALCIUM 9.2 11/22/2021   PROT 6.6 11/22/2021   ALBUMIN 4.5 11/22/2021   LABGLOB 2.1 11/22/2021   AGRATIO 2.1 11/22/2021   BILITOT 0.5 11/22/2021   ALKPHOS 69 11/22/2021   AST 18  11/22/2021   ALT 33 11/22/2021   Last lipids Lab Results  Component Value Date   CHOL 227 (H) 11/22/2021   HDL 30 (L) 11/22/2021   LDLCALC 119 (H) 11/22/2021   TRIG 441 (H) 11/22/2021   CHOLHDL 7.6 (H) 11/22/2021      The ASCVD Risk score (Arnett DK, et al., 2019) failed to calculate for the following reasons:   The 2019 ASCVD risk score is only valid for ages 9 to 2    Assessment & Plan:   Duane Dixon was seen today for anxiety.  Diagnoses and all orders for this visit:  Generalized anxiety disorder Increase prozac to 20 mg daily.  -     FLUoxetine (PROZAC) 20 MG capsule; Take 1 capsule (20 mg total) by mouth daily.  Psychophysiological insomnia Can try trazodone prn. Discussed this should improve with better control of anxiety. He has failed OTC measures.  -     traZODone (DESYREL) 50 MG tablet; Take 0.5-1 tablets (25-50 mg total) by mouth at bedtime as needed for sleep.  Primary hypertension Well controlled on current regimen.   Mixed hyperlipidemia He will start crestor 5 mg. Will recheck in 3 months after starting.   Acute gout involving toe of right foot, unspecified cause Discussed consistent with gout. Discussed diet, follow up for acute symptoms.   Return in about 6 weeks (around 01/06/2022) for medication follow up.   The patient indicates understanding of these issues and agrees with the plan.  Gwenlyn Perking, FNP

## 2021-11-25 NOTE — Patient Instructions (Signed)
Insomnia Insomnia is a sleep disorder that makes it difficult to fall asleep or stay asleep. Insomnia can cause fatigue, low energy, difficulty concentrating, mood swings, and poor performance at work or school. There are three different ways to classify insomnia: Difficulty falling asleep. Difficulty staying asleep. Waking up too early in the morning. Any type of insomnia can be long-term (chronic) or short-term (acute). Both are common. Short-term insomnia usually lasts for 3 months or less. Chronic insomnia occurs at least three times a week for longer than 3 months. What are the causes? Insomnia may be caused by another condition, situation, or substance, such as: Having certain mental health conditions, such as anxiety and depression. Using caffeine, alcohol, tobacco, or drugs. Having gastrointestinal conditions, such as gastroesophageal reflux disease (GERD). Having certain medical conditions. These include: Asthma. Alzheimer's disease. Stroke. Chronic pain. An overactive thyroid gland (hyperthyroidism). Other sleep disorders, such as restless legs syndrome and sleep apnea. Menopause. Sometimes, the cause of insomnia may not be known. What increases the risk? Risk factors for insomnia include: Gender. Females are affected more often than males. Age. Insomnia is more common as people get older. Stress and certain medical and mental health conditions. Lack of exercise. Having an irregular work schedule. This may include working night shifts and traveling between different time zones. What are the signs or symptoms? If you have insomnia, the main symptom is having trouble falling asleep or having trouble staying asleep. This may lead to other symptoms, such as: Feeling tired or having low energy. Feeling nervous about going to sleep. Not feeling rested in the morning. Having trouble concentrating. Feeling irritable, anxious, or depressed. How is this diagnosed? This condition  may be diagnosed based on: Your symptoms and medical history. Your health care provider may ask about: Your sleep habits. Any medical conditions you have. Your mental health. A physical exam. How is this treated? Treatment for insomnia depends on the cause. Treatment may focus on treating an underlying condition that is causing the insomnia. Treatment may also include: Medicines to help you sleep. Counseling or therapy. Lifestyle adjustments to help you sleep better. Follow these instructions at home: Eating and drinking  Limit or avoid alcohol, caffeinated beverages, and products that contain nicotine and tobacco, especially close to bedtime. These can disrupt your sleep. Do not eat a large meal or eat spicy foods right before bedtime. This can lead to digestive discomfort that can make it hard for you to sleep. Sleep habits  Keep a sleep diary to help you and your health care provider figure out what could be causing your insomnia. Write down: When you sleep. When you wake up during the night. How well you sleep and how rested you feel the next day. Any side effects of medicines you are taking. What you eat and drink. Make your bedroom a dark, comfortable place where it is easy to fall asleep. Put up shades or blackout curtains to block light from outside. Use a white noise machine to block noise. Keep the temperature cool. Limit screen use before bedtime. This includes: Not watching TV. Not using your smartphone, tablet, or computer. Stick to a routine that includes going to bed and waking up at the same times every day and night. This can help you fall asleep faster. Consider making a quiet activity, such as reading, part of your nighttime routine. Try to avoid taking naps during the day so that you sleep better at night. Get out of bed if you are still awake after   15 minutes of trying to sleep. Keep the lights down, but try reading or doing a quiet activity. When you feel  sleepy, go back to bed. General instructions Take over-the-counter and prescription medicines only as told by your health care provider. Exercise regularly as told by your health care provider. However, avoid exercising in the hours right before bedtime. Use relaxation techniques to manage stress. Ask your health care provider to suggest some techniques that may work well for you. These may include: Breathing exercises. Routines to release muscle tension. Visualizing peaceful scenes. Make sure that you drive carefully. Do not drive if you feel very sleepy. Keep all follow-up visits. This is important. Contact a health care provider if: You are tired throughout the day. You have trouble in your daily routine due to sleepiness. You continue to have sleep problems, or your sleep problems get worse. Get help right away if: You have thoughts about hurting yourself or someone else. Get help right away if you feel like you may hurt yourself or others, or have thoughts about taking your own life. Go to your nearest emergency room or: Call 911. Call the National Suicide Prevention Lifeline at 1-800-273-8255 or 988. This is open 24 hours a day. Text the Crisis Text Line at 741741. Summary Insomnia is a sleep disorder that makes it difficult to fall asleep or stay asleep. Insomnia can be long-term (chronic) or short-term (acute). Treatment for insomnia depends on the cause. Treatment may focus on treating an underlying condition that is causing the insomnia. Keep a sleep diary to help you and your health care provider figure out what could be causing your insomnia. This information is not intended to replace advice given to you by your health care provider. Make sure you discuss any questions you have with your health care provider. Document Revised: 01/10/2021 Document Reviewed: 01/10/2021 Elsevier Patient Education  2023 [employer]  

## 2021-12-30 ENCOUNTER — Ambulatory Visit (INDEPENDENT_AMBULATORY_CARE_PROVIDER_SITE_OTHER): Payer: BC Managed Care – PPO | Admitting: Family Medicine

## 2021-12-30 ENCOUNTER — Encounter: Payer: Self-pay | Admitting: Family Medicine

## 2021-12-30 VITALS — BP 133/74 | HR 93 | Temp 98.1°F | Ht 72.0 in | Wt 271.0 lb

## 2021-12-30 DIAGNOSIS — F330 Major depressive disorder, recurrent, mild: Secondary | ICD-10-CM | POA: Insufficient documentation

## 2021-12-30 DIAGNOSIS — F5104 Psychophysiologic insomnia: Secondary | ICD-10-CM | POA: Insufficient documentation

## 2021-12-30 DIAGNOSIS — E782 Mixed hyperlipidemia: Secondary | ICD-10-CM

## 2021-12-30 DIAGNOSIS — F411 Generalized anxiety disorder: Secondary | ICD-10-CM

## 2021-12-30 NOTE — Patient Instructions (Signed)
How to use light therapy Light therapy requires a light box that emits 10,000 lux (a measure of light intensity). You sit in front of the light for about 30 minutes every morning, as soon as possible after you wake up. Light boxes aren't regulated, so it's important to make sure you buy one that meets certain specifications. Dr. Hermelinda Medicus recommends checking out the [employer], a nonprofit organization that does research on light boxes and related therapies and provides advice for selecting a light box. Prices range between $100 and $200.  To use a light box, place it in front of you or slightly off to the side. Don't look directly at the light but keep your eyes open. You can eat, read, watch television, or work on a computer while you're under the light.

## 2021-12-30 NOTE — Progress Notes (Signed)
Acute Office Visit  Subjective:     Patient ID: Duane Dixon, male    DOB: 1991-02-03, 31 y.o.   MRN: [id number]  Chief Complaint  Patient presents with   Anxiety    HPI Patient is in today for follow up of anxiety. He has been on prozac 20 mg and also started trazodone prn for sleep. Trazodone has helped with sleep. He has some grogginess in the morning but this wears off. He reports that symptoms have been up and down. He does struggle more in the winter with increased depression symptoms after the time change. He does not get much daylight exposure on days that he works during this time. Overall, he feels like his symptoms are manageable and does not desire a change in regimen at this time.    He also started crestor since his last visit. He denies side effects from this.      12/30/2021    3:03 PM 11/25/2021    3:14 PM 10/14/2021    4:14 PM  Depression screen PHQ 2/9  Decreased Interest 1 1 1   Down, Depressed, Hopeless 1 1 1   PHQ - 2 Score 2 2 2   Altered sleeping 1 2 1   Tired, decreased energy 1 1 0  Change in appetite 0 0 0  Feeling bad or failure about yourself  0 0 0  Trouble concentrating 0 0 0  Moving slowly or fidgety/restless 0 0 0  Suicidal thoughts 0 0 0  PHQ-9 Score 4 5 3   Difficult doing work/chores Somewhat difficult Somewhat difficult Not difficult at all      12/30/2021    3:03 PM 11/25/2021    3:15 PM 10/14/2021    4:14 PM 08/12/2020    3:51 PM  GAD 7 : Generalized Anxiety Score  Nervous, Anxious, on Edge 1 1 1  0  Control/stop worrying 1 1 1  0  Worry too much - different things 1 1 1  0  Trouble relaxing 1 1 1 1   Restless 0 0 0 0  Easily annoyed or irritable 0 0 0 1  Afraid - awful might happen 1 1 1  0  Total GAD 7 Score 5 5 5 2   Anxiety Difficulty Somewhat difficult Somewhat difficult Not difficult at all Not difficult at all     ROS As per HPI.      Objective:    BP 133/74   Pulse 93   Temp 98.1 F (36.7 C) (Temporal)   Ht 6' (1.829  m)   Wt 271 lb (122.9 kg)   SpO2 97%   BMI 36.75 kg/m    Physical Exam Vitals and nursing note reviewed.  Constitutional:      General: He is not in acute distress.    Appearance: He is not ill-appearing, toxic-appearing or diaphoretic.  Cardiovascular:     Rate and Rhythm: Normal rate and regular rhythm.     Heart sounds: Normal heart sounds. No murmur heard. Pulmonary:     Effort: Pulmonary effort is normal.     Breath sounds: Normal breath sounds.  Abdominal:     General: Bowel sounds are normal. There is no distension.     Palpations: Abdomen is soft.     Tenderness: There is no abdominal tenderness. There is no guarding or rebound.  Musculoskeletal:     Right lower leg: No edema.     Left lower leg: No edema.  Skin:    General: Skin is warm and dry.  Neurological:  General: No focal deficit present.     Mental Status: He is alert and oriented to person, place, and time.  Psychiatric:        Mood and Affect: Mood normal.        Behavior: Behavior normal.        Thought Content: Thought content normal.        Judgment: Judgment normal.     No results found for any visits on 12/30/21.      Assessment & Plan:   Quency was seen today for anxiety.  Diagnoses and all orders for this visit:  Generalized anxiety disorder Mild recurrent major depression (HCC) Fair control with Prozac. Does not current desire change in regimen, will continue current dosage. Discussed light therapy for SAD.   Psychophysiological insomnia Improved with trazodone prn.   Mixed hyperlipidemia Recently started crestor. Will check lipid panel at next visit.    Return in about 3 months (around 04/01/2022) for chronic follow up, check lipid panel.  The patient indicates understanding of these issues and agrees with the plan.  Gabriel Earing, FNP

## 2022-02-17 ENCOUNTER — Other Ambulatory Visit: Payer: Self-pay | Admitting: Family Medicine

## 2022-02-17 DIAGNOSIS — G8929 Other chronic pain: Secondary | ICD-10-CM

## 2022-04-03 ENCOUNTER — Ambulatory Visit: Payer: BC Managed Care – PPO | Admitting: Family Medicine

## 2022-08-10 IMAGING — DX DG ANKLE COMPLETE 3+V*L*
3 series · 3 of 3 positions shown · non-contrast
Comparison: None.

CLINICAL DATA: 29-year-old male with chronic LEFT ankle pain.
Initial encounter.

EXAM:
LEFT ANKLE COMPLETE - 3+ VIEW

[ankle ap]
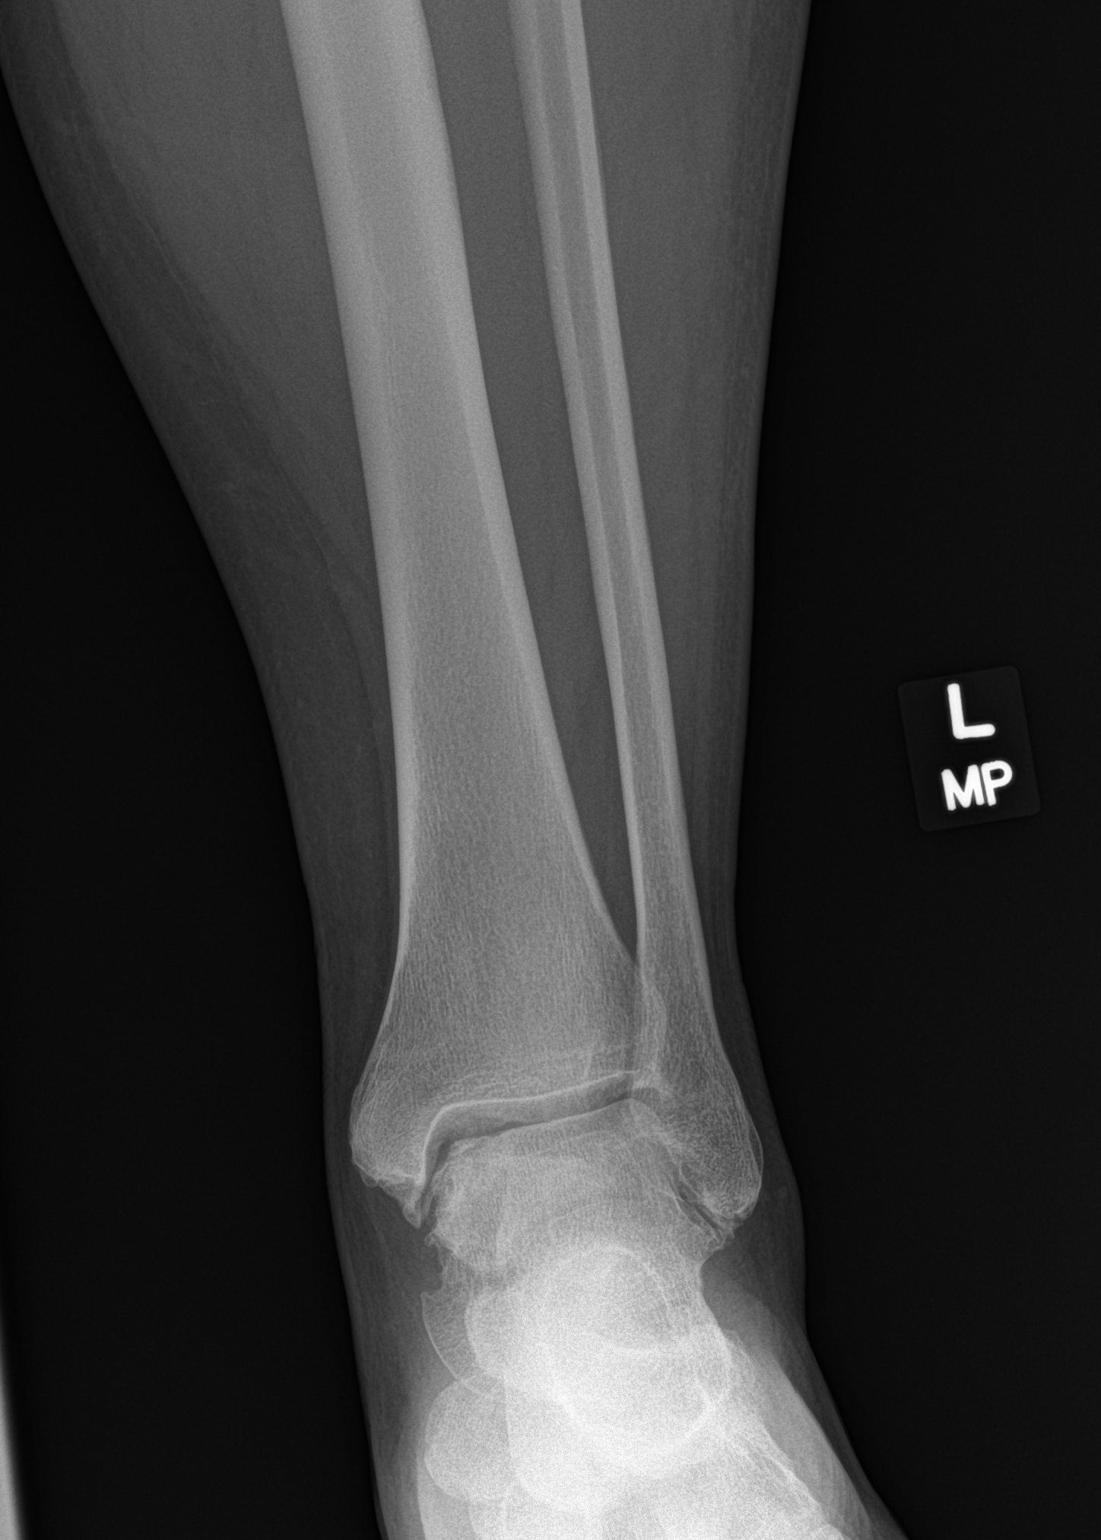

[ankle obl]
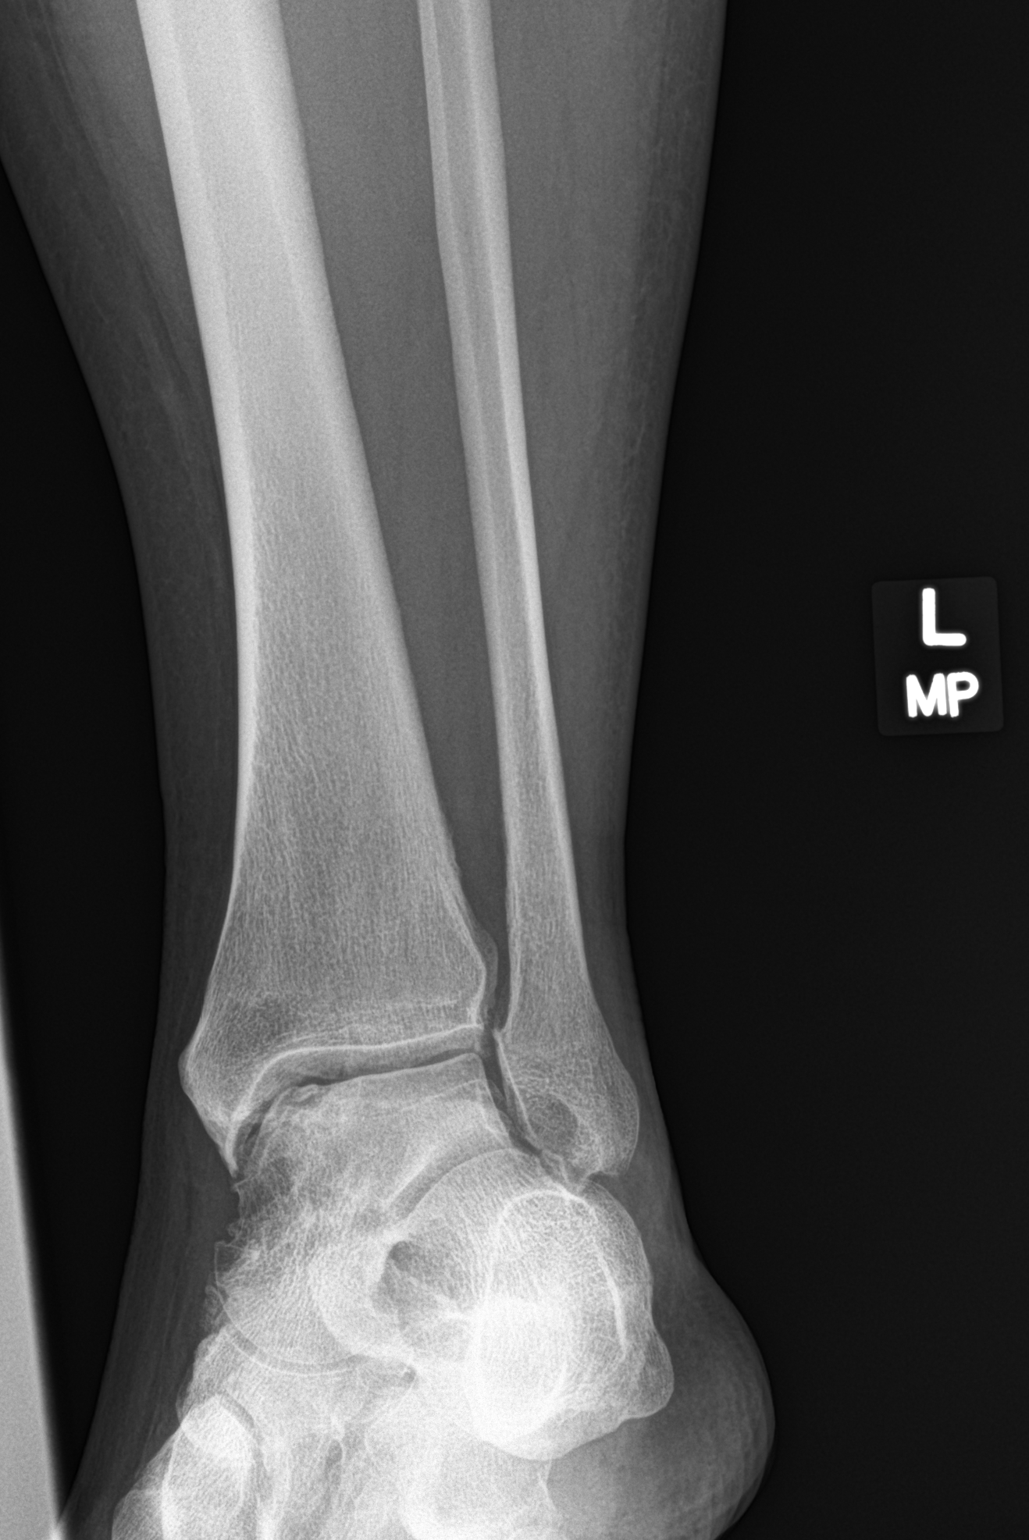

[ankle lat]
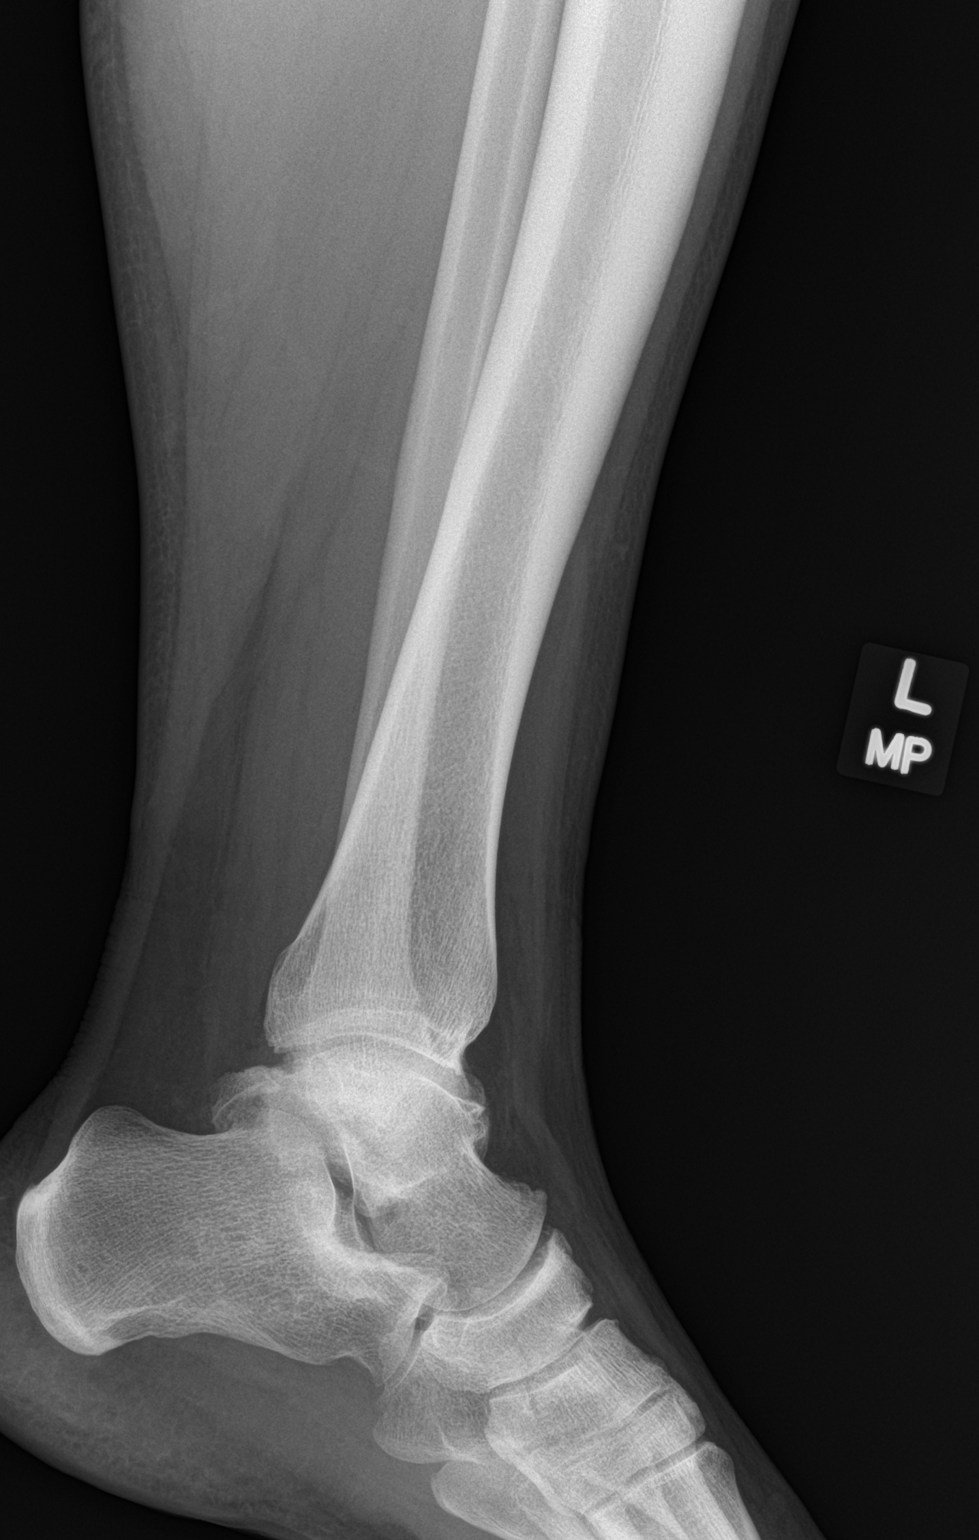

[3 of 3 positions shown; findings below may reference images not displayed]

FINDINGS: No acute fracture, subluxation or dislocation identified.

No ankle effusion noted.

Moderate degenerative changes of the MEDIAL tibiotalar joint and
LATERAL ankle noted.

Irregularity of MEDIAL talar dome may be related to prior
osteochondral injury or degenerative changes.
IMPRESSION: 1. No evidence of acute abnormality.
2. Moderate degenerative changes of the MEDIAL tibiotalar joint and
LATERAL ankle.

## 2023-02-12 ENCOUNTER — Ambulatory Visit: Payer: Self-pay | Admitting: Family Medicine

## 2023-02-12 NOTE — Telephone Encounter (Signed)
Copied from CRM [id number]. 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  She said he told her that he has started having soreness above his left breast, and radiates around to the left shoulder blade area. Patient told his mother that the soreness is off and on and she states that he has gained a good amount of weight in the past year. Patient emphasizes that this feels like a pulled muscle but his mother advised him that he still needed to get it assessed. Patient's mother states that she would like the patient tested for genetic heart defects because she states that she has a duplicated SVC and an aberrant right subclavian artery syndrome.  Patient's mother states that around Christmas she asked the patient if he needed to go to the emergency room at that time, and he did not want to go then. Patient's mother states his blood pressure was been running high and he wants that under control.  Appointment was already made for January 30th prior to triage and mother would like patient to be seen sooner. Mother is advised that if anything worsens with the patient for him  to go to the emergency room immediately to be evaluated.  Mother verbalized understanding and states that her son (the patient) has MyChart access.      Reason for Disposition  [1] Chest pain (or "angina") comes and goes AND [2] is happening more often (increasing in frequency) or getting worse (increasing in severity)  (Exception: Chest pains that last only a few seconds.)  Answer Assessment - Initial Assessment Questions 1. LOCATION: "Where does it hurt?"       Above left breast and around to the left shoulder blade 2. RADIATION: "Does the pain go anywhere else?" (e.g., into neck, jaw, arms, back)     Around to the left shoulder blade 3. ONSET: "When did the chest pain begin?" (Minutes, hours or days)      Patient told his mother it was about a month ago 4. PATTERN: "Does the pain come and go, or has it been constant since it started?"  "Does it get worse with exertion?"      Comes and goes, but never totally goes away 5. DURATION: "How long does it last" (e.g., seconds, minutes, hours)     "Sometimes it catches but it is always there" 6. SEVERITY: "How bad is the pain?"  (e.g., Scale 1-10; mild, moderate, or severe)    - MILD (1-3): doesn't interfere with normal activities     - MODERATE (4-7): interferes with normal activities or awakens from sleep    - SEVERE (8-10): excruciating pain, unable to do any normal activities       "  Catches" 7. CARDIAC RISK FACTORS: "Do you have any history of heart problems or risk factors for heart disease?" (e.g., angina, prior heart attack; diabetes, high blood pressure, high cholesterol, smoker, or strong family history of heart disease)     High blood pressure 8. PULMONARY RISK FACTORS: "Do you have any history of lung disease?"  (e.g., blood clots in lung, asthma, emphysema, birth control pills)     No 9. CAUSE: "What do you think is causing the chest pain?"     Patient thinks it is a pulled muscle 10. OTHER SYMPTOMS: "Do you have any other  symptoms?" (e.g., dizziness, nausea, vomiting, sweating, fever, difficulty breathing, cough)       Denies  Protocols used: Chest Pain-A-AH

## 2023-02-12 NOTE — Telephone Encounter (Signed)
Pt scheduled for CPE 02/23/2023 with PCP and pt aware.

## 2023-02-23 ENCOUNTER — Ambulatory Visit (INDEPENDENT_AMBULATORY_CARE_PROVIDER_SITE_OTHER): Payer: BC Managed Care – PPO | Admitting: Family Medicine

## 2023-02-23 ENCOUNTER — Ambulatory Visit (INDEPENDENT_AMBULATORY_CARE_PROVIDER_SITE_OTHER): Payer: BC Managed Care – PPO

## 2023-02-23 VITALS — BP 149/81 | HR 89 | Temp 98.3°F | Ht 72.0 in | Wt 279.6 lb

## 2023-02-23 DIAGNOSIS — F5104 Psychophysiologic insomnia: Secondary | ICD-10-CM

## 2023-02-23 DIAGNOSIS — F330 Major depressive disorder, recurrent, mild: Secondary | ICD-10-CM

## 2023-02-23 DIAGNOSIS — I10 Essential (primary) hypertension: Secondary | ICD-10-CM | POA: Diagnosis not present

## 2023-02-23 DIAGNOSIS — Z8279 Family history of other congenital malformations, deformations and chromosomal abnormalities: Secondary | ICD-10-CM

## 2023-02-23 DIAGNOSIS — Z0001 Encounter for general adult medical examination with abnormal findings: Secondary | ICD-10-CM | POA: Diagnosis not present

## 2023-02-23 DIAGNOSIS — R21 Rash and other nonspecific skin eruption: Secondary | ICD-10-CM | POA: Diagnosis not present

## 2023-02-23 DIAGNOSIS — G8929 Other chronic pain: Secondary | ICD-10-CM

## 2023-02-23 DIAGNOSIS — M25572 Pain in left ankle and joints of left foot: Secondary | ICD-10-CM

## 2023-02-23 DIAGNOSIS — E782 Mixed hyperlipidemia: Secondary | ICD-10-CM

## 2023-02-23 DIAGNOSIS — F411 Generalized anxiety disorder: Secondary | ICD-10-CM

## 2023-02-23 DIAGNOSIS — Z0000 Encounter for general adult medical examination without abnormal findings: Secondary | ICD-10-CM

## 2023-02-23 LAB — BAYER DCA HB A1C WAIVED: HB A1C (BAYER DCA - WAIVED): 5.2 % (ref 4.8–5.6)

## 2023-02-23 MED ORDER — DICLOFENAC SODIUM 75 MG PO TBEC: 75.0000 mg | DELAYED_RELEASE_TABLET | Freq: Two times a day (BID) | ORAL | 0 refills | Status: DC

## 2023-02-23 MED ORDER — TRIAMCINOLONE ACETONIDE 0.5 % EX OINT: 1.0000 | TOPICAL_OINTMENT | Freq: Two times a day (BID) | CUTANEOUS | 0 refills | Status: AC

## 2023-02-23 MED ORDER — LISINOPRIL 10 MG PO TABS: 10.0000 mg | ORAL_TABLET | Freq: Every day | ORAL | 0 refills | Status: DC

## 2023-02-23 NOTE — Patient Instructions (Signed)
 Health Maintenance, Male  Adopting a healthy lifestyle and getting preventive care are important in promoting health and wellness. Ask your health care provider about:  The right schedule for you to have regular tests and exams.  Things you can do on your own to prevent diseases and keep yourself healthy.  What should I know about diet, weight, and exercise?  Eat a healthy diet    Eat a diet that includes plenty of vegetables, fruits, low-fat dairy products, and lean protein.  Do not eat a lot of foods that are high in solid fats, added sugars, or sodium.  Maintain a healthy weight  Body mass index (BMI) is a measurement that can be used to identify possible weight problems. It estimates body fat based on height and weight. Your health care provider can help determine your BMI and help you achieve or maintain a healthy weight.  Get regular exercise  Get regular exercise. This is one of the most important things you can do for your health. Most adults should:  Exercise for at least 150 minutes each week. The exercise should increase your heart rate and make you sweat (moderate-intensity exercise).  Do strengthening exercises at least twice a week. This is in addition to the moderate-intensity exercise.  Spend less time sitting. Even light physical activity can be beneficial.  Watch cholesterol and blood lipids  Have your blood tested for lipids and cholesterol at 31 years of age, then have this test every 5 years.  You may need to have your cholesterol levels checked more often if:  Your lipid or cholesterol levels are high.  You are older than 31 years of age.  You are at high risk for heart disease.  What should I know about cancer screening?  Many types of cancers can be detected early and may often be prevented. Depending on your health history and family history, you may need to have cancer screening at various ages. This may include screening for:  Colorectal cancer.  Prostate cancer.  Skin cancer.  Lung  cancer.  What should I know about heart disease, diabetes, and high blood pressure?  Blood pressure and heart disease  High blood pressure causes heart disease and increases the risk of stroke. This is more likely to develop in people who have high blood pressure readings or are overweight.  Talk with your health care provider about your target blood pressure readings.  Have your blood pressure checked:  Every 3-5 years if you are 24-52 years of age.  Every year if you are 3 years old or older.  If you are between the ages of 60 and 72 and are a current or former smoker, ask your health care provider if you should have a one-time screening for abdominal aortic aneurysm (AAA).  Diabetes  Have regular diabetes screenings. This checks your fasting blood sugar level. Have the screening done:  Once every three years after age 66 if you are at a normal weight and have a low risk for diabetes.  More often and at a younger age if you are overweight or have a high risk for diabetes.  What should I know about preventing infection?  Hepatitis B  If you have a higher risk for hepatitis B, you should be screened for this virus. Talk with your health care provider to find out if you are at risk for hepatitis B infection.  Hepatitis C  Blood testing is recommended for:  Everyone born from [birth year] through 1963.  Anyone  with known risk factors for hepatitis C.  Sexually transmitted infections (STIs)  You should be screened each year for STIs, including gonorrhea and chlamydia, if:  You are sexually active and are younger than 31 years of age.  You are older than 31 years of age and your health care provider tells you that you are at risk for this type of infection.  Your sexual activity has changed since you were last screened, and you are at increased risk for chlamydia or gonorrhea. Ask your health care provider if you are at risk.  Ask your health care provider about whether you are at high risk for HIV. Your health care provider  may recommend a prescription medicine to help prevent HIV infection. If you choose to take medicine to prevent HIV, you should first get tested for HIV. You should then be tested every 3 months for as long as you are taking the medicine.  Follow these instructions at home:  Alcohol use  Do not drink alcohol if your health care provider tells you not to drink.  If you drink alcohol:  Limit how much you have to 0-2 drinks a day.  Know how much alcohol is in your drink. In the [place] one drink equals one 12 oz bottle of beer (355 mL), one 5 oz glass of wine (148 mL), or one 1 oz glass of hard liquor (44 mL).  Lifestyle  Do not use any products that contain nicotine or tobacco. These products include cigarettes, chewing tobacco, and vaping devices, such as e-cigarettes. If you need help quitting, ask your health care provider.  Do not use street drugs.  Do not share needles.  Ask your health care provider for help if you need support or information about quitting drugs.  General instructions  Schedule regular health, dental, and eye exams.  Stay current with your vaccines.  Tell your health care provider if:  You often feel depressed.  You have ever been abused or do not feel safe at home.  Summary  Adopting a healthy lifestyle and getting preventive care are important in promoting health and wellness.  Follow your health care provider's instructions about healthy diet, exercising, and getting tested or screened for diseases.  Follow your health care provider's instructions on monitoring your cholesterol and blood pressure.  This information is not intended to replace advice given to you by your health care provider. Make sure you discuss any questions you have with your health care provider.  Document Revised: 06/21/2020 Document Reviewed: 06/21/2020  Elsevier Patient Education  2024 [employer].

## 2023-02-23 NOTE — Progress Notes (Signed)
 Complete physical exam  Patient: Duane Dixon   DOB: [birth year]   33 y.o. Male  MRN: [id number]  Subjective:    Chief Complaint  Patient presents with   Annual Exam    Duane Dixon is a 33 y.o. male who presents today for a complete physical exam. He reports consuming a low salt diet. He has switched to leaner meats. The patient has a physically strenuous job, but has no regular exercise apart from work.  He generally feels fairly well. He reports sleeping poorly. He does have additional problems to discuss today.   He has not been taking lisinopril . BP has been 150s up 170s at home and 80-110s diastolic. He did have some mild achy pain in his left chest last week. Pain was worse with movement in his shoulder. Pain did not improve with rest and worsened with body position. Now resolved. Denies dyspnea, palpations, dizziness, changes in vision, edema. He has had some headaches.   His mother is here with him today. She has a history of duplicated SVC and aberrant right subclavian artery defect. Khang had a CXR as a child with pneumonia but not a recent CXR. Never had an EKG. Interested in referral to cardiology.   He has had difficulty falling sleep. Previously was on trazodone . This gave him a HA however. He has tried benadryl, melatonin without relief. Stopped SSRI and isn't interested in restarting at this time.   He would like a referral of voltaren  for arthritis on his ankle. He has had increased pain since being out of this.   He has a rash on his right hand for about 2 weeks. Red, scaly patch. Improved, then worsen again. Denies exudate, itching, burning, spreading. Hasn't tried any remedies. Works with chemicals.   Most recent fall risk assessment:    11/25/2021    3:14 PM  Fall Risk   Falls in the past year? 0     Most recent depression screenings:    12/30/2021    3:03 PM 11/25/2021    3:14 PM 10/14/2021    4:14 PM  Depression screen PHQ 2/9  Decreased Interest 1 1 1    Down, Depressed, Hopeless 1 1 1   PHQ - 2 Score 2 2 2   Altered sleeping 1 2 1   Tired, decreased energy 1 1 0  Change in appetite 0 0 0  Feeling bad or failure about yourself  0 0 0  Trouble concentrating 0 0 0  Moving slowly or fidgety/restless 0 0 0  Suicidal thoughts 0 0 0  PHQ-9 Score 4 5 3   Difficult doing work/chores Somewhat difficult Somewhat difficult Not difficult at all      12/30/2021    3:03 PM 11/25/2021    3:15 PM 10/14/2021    4:14 PM 08/12/2020    3:51 PM  GAD 7 : Generalized Anxiety Score  Nervous, Anxious, on Edge 1 1 1  0  Control/stop worrying 1 1 1  0  Worry too much - different things 1 1 1  0  Trouble relaxing 1 1 1 1   Restless 0 0 0 0  Easily annoyed or irritable 0 0 0 1  Afraid - awful might happen 1 1 1  0  Total GAD 7 Score 5 5 5 2   Anxiety Difficulty Somewhat difficult Somewhat difficult Not difficult at all Not difficult at all      Vision:Not within last year  and Dental: No current dental problems and No regular dental care   Past Medical History:  Diagnosis Date   Allergy       Patient Care Team: Joesph Annabella HERO, FNP as PCP - General (Family Medicine)   Outpatient Medications Prior to Visit  Medication Sig   acetaminophen (TYLENOL) 500 MG tablet Take 500 mg by mouth every 6 (six) hours as needed.   diclofenac  (VOLTAREN ) 75 MG EC tablet Take 1 tablet by mouth twice daily (Patient not taking: Reported on 02/23/2023)   FLUoxetine  (PROZAC ) 20 MG capsule Take 1 capsule (20 mg total) by mouth daily. (Patient not taking: Reported on 02/23/2023)   lisinopril  (ZESTRIL ) 10 MG tablet Take 1 tablet (10 mg total) by mouth daily. Take with a 20 mg daily (total 30 mg) (Patient not taking: Reported on 02/23/2023)   lisinopril  (ZESTRIL ) 20 MG tablet Take 1 tablet (20 mg total) by mouth daily. Take with a 10 mg daily (total 30 mg) (Patient not taking: Reported on 02/23/2023)   rosuvastatin  (CRESTOR ) 5 MG tablet Take 1 tablet (5 mg total) by mouth daily.  (Patient not taking: Reported on 02/23/2023)   traZODone  (DESYREL ) 50 MG tablet Take 0.5-1 tablets (25-50 mg total) by mouth at bedtime as needed for sleep. (Patient not taking: Reported on 02/23/2023)   No facility-administered medications prior to visit.    ROS Negative unless specially indicated above in HPI.      Objective:     BP (!) 149/81   Pulse 89   Temp 98.3 F (36.8 C) (Temporal)   Ht 6' (1.829 m)   Wt 279 lb 9.6 oz (126.8 kg)   SpO2 100%   BMI 37.92 kg/m    Physical Exam Vitals and nursing note reviewed.  Constitutional:      General: He is not in acute distress.    Appearance: He is obese. He is not ill-appearing, toxic-appearing or diaphoretic.  HENT:     Head: Normocephalic.     Right Ear: Tympanic membrane, ear canal and external ear normal.     Left Ear: Tympanic membrane, ear canal and external ear normal.     Nose: Nose normal.     Mouth/Throat:     Mouth: Mucous membranes are moist.     Pharynx: Oropharynx is clear.  Eyes:     Extraocular Movements: Extraocular movements intact.     Conjunctiva/sclera: Conjunctivae normal.     Pupils: Pupils are equal, round, and reactive to light.  Neck:     Thyroid : No thyroid  mass, thyromegaly or thyroid  tenderness.  Cardiovascular:     Rate and Rhythm: Normal rate and regular rhythm.     Pulses: Normal pulses.     Heart sounds: Normal heart sounds. No murmur heard.    No friction rub. No gallop.  Pulmonary:     Effort: Pulmonary effort is normal.     Breath sounds: Normal breath sounds.  Abdominal:     General: Bowel sounds are normal. There is no distension.     Palpations: Abdomen is soft. There is no mass.     Tenderness: There is no abdominal tenderness. There is no guarding.  Musculoskeletal:        General: No swelling. Normal range of motion.     Cervical back: Normal range of motion and neck supple. No tenderness.     Right lower leg: No edema.     Left lower leg: No edema.  Skin:    General:  Skin is warm and dry.     Capillary Refill: Capillary refill takes less than 2 seconds.  Findings: Rash (erythematous dry patch to right hand. No exudate.) present.  Neurological:     General: No focal deficit present.     Mental Status: He is alert and oriented to person, place, and time.     Cranial Nerves: No cranial nerve deficit.     Sensory: No sensory deficit.     Motor: No weakness.     Coordination: Coordination normal.     Gait: Gait normal.  Psychiatric:        Mood and Affect: Mood normal.        Behavior: Behavior normal.        Thought Content: Thought content normal.      No results found for any visits on 02/23/23.     Assessment & Plan:    Routine Health Maintenance and Physical Exam  Odie was seen today for annual exam.  Diagnoses and all orders for this visit:  Routine general medical examination at a health care facility  Primary hypertension Family history of congenital heart defect Uncontrolled. EKG with NSR. CXR today, radiology read pending. Restart lisinopril  daily. Labs pending. Discussed referral to cards due to family hx pending CXR report. Will have him return in 2 weeks to recheck BP.  -     DG Chest 2 View; Future -     EKG 12-Lead -     CBC with Differential/Platelet -     CMP14+EGFR -     TSH -     lisinopril  (ZESTRIL ) 10 MG tablet; Take 1 tablet (10 mg total) by mouth daily.  Mixed hyperlipidemia Labs pending. Diet, exercise, weight loss. No longer on statin.  -     Lipid panel  Morbid obesity (HCC) Diet, exercise, weight loss. A1c pending.  -     Bayer DCA Hb A1c Waived  Mild recurrent major depression (HCC) Generalized anxiety disorder Stable. Declines treatment.   Chronic pain of left ankle Refill provided.  -     diclofenac  (VOLTAREN ) 75 MG EC tablet; Take 1 tablet (75 mg total) by mouth 2 (two) times daily.  Rash Consistent with eczema. Kenalog  BID prn.  -     triamcinolone  ointment (KENALOG ) 0.5 %; Apply 1  Application topically 2 (two) times daily for 10 days.  Chronic insomnia HA with trazodone . Try unisom OTC. Discussed doxepin at follow up if no improvement. Sleep hygiene.   Immunization History  Administered Date(s) Administered   Tdap 08/12/2020    Health Maintenance  Topic Date Due   INFLUENZA VACCINE  05/14/2023 (Originally 09/14/2022)   COVID-19 Vaccine (1 - 2024-25 season) 03/10/2024 (Originally 10/15/2022)   DTaP/Tdap/Td (2 - Td or Tdap) 08/13/2030   Hepatitis C Screening  Completed   HIV Screening  Completed   HPV VACCINES  Aged Out    Discussed health benefits of physical activity, and encouraged him to engage in regular exercise appropriate for his age and condition.  Problem List Items Addressed This Visit       Cardiovascular and Mediastinum   Primary hypertension   Relevant Medications   lisinopril  (ZESTRIL ) 10 MG tablet   Other Relevant Orders   DG Chest 2 View   EKG 12-Lead (Completed)   CBC with Differential/Platelet   CMP14+EGFR   TSH     Other   Chronic pain of left ankle   Relevant Medications   diclofenac  (VOLTAREN ) 75 MG EC tablet   Mixed hyperlipidemia   Relevant Medications   lisinopril  (ZESTRIL ) 10 MG tablet   Other Relevant Orders  Lipid panel   Morbid obesity (HCC)   Relevant Orders   Bayer DCA Hb A1c Waived   Generalized anxiety disorder   Mild recurrent major depression (HCC)   Family history of congenital heart defect   Other Visit Diagnoses       Routine general medical examination at a health care facility    -  Primary     Rash       Relevant Medications   triamcinolone  ointment (KENALOG ) 0.5 %      Return in about 2 weeks (around 03/09/2023) for medication follow up.   The patient indicates understanding of these issues and agrees with the plan.  Annabella CHRISTELLA Search, FNP

## 2023-02-24 LAB — CMP14+EGFR
ALT: 52 [IU]/L — ABNORMAL HIGH (ref 0–44)
AST: 31 [IU]/L (ref 0–40)
Albumin: 4.6 g/dL (ref 4.3–5.2)
Alkaline Phosphatase: 67 [IU]/L (ref 44–121)
BUN/Creatinine Ratio: 14 (ref 9–20)
BUN: 13 mg/dL (ref 6–20)
Bilirubin Total: 0.5 mg/dL (ref 0.0–1.2)
CO2: 23 mmol/L (ref 20–29)
Calcium: 9.7 mg/dL (ref 8.7–10.2)
Chloride: 104 mmol/L (ref 96–106)
Creatinine, Ser: 0.93 mg/dL (ref 0.76–1.27)
Globulin, Total: 2.3 g/dL (ref 1.5–4.5)
Glucose: 94 mg/dL (ref 70–99)
Potassium: 4.7 mmol/L (ref 3.5–5.2)
Sodium: 141 mmol/L (ref 134–144)
Total Protein: 6.9 g/dL (ref 6.0–8.5)
eGFR: 113 mL/min/{1.73_m2} (ref >59)

## 2023-02-24 LAB — CBC WITH DIFFERENTIAL/PLATELET
Basophils Absolute: 0 10*3/uL (ref 0.0–0.2)
Basos: 1 %
EOS (ABSOLUTE): 0.1 10*3/uL (ref 0.0–0.4)
Eos: 1 %
Hematocrit: 46 % (ref 37.5–51.0)
Hemoglobin: 15.7 g/dL (ref 13.0–17.7)
Immature Grans (Abs): 0 10*3/uL (ref 0.0–0.1)
Immature Granulocytes: 1 %
Lymphocytes Absolute: 1.1 10*3/uL (ref 0.7–3.1)
Lymphs: 17 %
MCH: 31.3 pg (ref 26.6–33.0)
MCHC: 34.1 g/dL (ref 31.5–35.7)
MCV: 92 fL (ref 79–97)
Monocytes Absolute: 0.6 10*3/uL (ref 0.1–0.9)
Monocytes: 10 %
Neutrophils Absolute: 4.3 10*3/uL (ref 1.4–7.0)
Neutrophils: 70 %
Platelets: 228 10*3/uL (ref 150–450)
RBC: 5.02 x10E6/uL (ref 4.14–5.80)
RDW: 13 % (ref 11.6–15.4)
WBC: 6.1 10*3/uL (ref 3.4–10.8)

## 2023-02-24 LAB — LIPID PANEL
Chol/HDL Ratio: 5.1 {ratio} — ABNORMAL HIGH (ref 0.0–5.0)
Cholesterol, Total: 169 mg/dL (ref 100–199)
HDL: 33 mg/dL — ABNORMAL LOW (ref >39)
LDL Chol Calc (NIH): 94 mg/dL (ref 0–99)
Triglycerides: 250 mg/dL — ABNORMAL HIGH (ref 0–149)
VLDL Cholesterol Cal: 42 mg/dL — ABNORMAL HIGH (ref 5–40)

## 2023-02-24 LAB — TSH: TSH: 2.57 u[IU]/mL (ref 0.450–4.500)

## 2023-02-26 NOTE — Addendum Note (Signed)
 Addended by: Gabriel Earing on: 02/26/2023 11:03 AM   Modules accepted: Orders

## 2023-03-01 ENCOUNTER — Encounter: Payer: Self-pay | Admitting: *Deleted

## 2023-03-09 ENCOUNTER — Encounter: Payer: Self-pay | Admitting: Family Medicine

## 2023-03-09 ENCOUNTER — Ambulatory Visit (INDEPENDENT_AMBULATORY_CARE_PROVIDER_SITE_OTHER): Payer: BC Managed Care – PPO | Admitting: Family Medicine

## 2023-03-09 VITALS — BP 143/77 | HR 97 | Temp 98.2°F | Ht 72.0 in | Wt 283.8 lb

## 2023-03-09 DIAGNOSIS — I10 Essential (primary) hypertension: Secondary | ICD-10-CM | POA: Diagnosis not present

## 2023-03-09 NOTE — Patient Instructions (Signed)
[facility] at [facility] [address], [place] - [place] [address] [phone]

## 2023-03-09 NOTE — Progress Notes (Signed)
   Established Patient Office Visit  Subjective   Patient ID: Duane Dixon, male    DOB: 07/27/1990  Age: 33 y.o. MRN: [id number]  Chief Complaint  Patient presents with   Hypertension    HPI  HTN Complaint with meds - Yes Current Medications - lisinopril 10 mg  Checking BP at home ranging 140s-150s/90-100 Pertinent ROS:  Visual Disturbances - No Chest pain - No Dyspnea - No Palpitations - No LE edema - No  Past Medical History:  Diagnosis Date   Allergy     ROS As per HPI.    Objective:     BP (!) 143/77   Pulse 97   Temp 98.2 F (36.8 C) (Temporal)   Ht 6' (1.829 m)   Wt 283 lb 12.8 oz (128.7 kg)   SpO2 100%   BMI 38.49 kg/m    Physical Exam Vitals and nursing note reviewed.  Constitutional:      General: He is not in acute distress.    Appearance: He is obese. He is not ill-appearing, toxic-appearing or diaphoretic.  Cardiovascular:     Rate and Rhythm: Normal rate and regular rhythm.     Heart sounds: Normal heart sounds. No murmur heard. Pulmonary:     Effort: Pulmonary effort is normal. No respiratory distress.     Breath sounds: Normal breath sounds. No wheezing, rhonchi or rales.  Musculoskeletal:     Right lower leg: No edema.     Left lower leg: No edema.  Skin:    General: Skin is warm and dry.  Neurological:     General: No focal deficit present.     Mental Status: He is alert and oriented to person, place, and time.  Psychiatric:        Mood and Affect: Mood normal.        Behavior: Behavior normal.    No results found for any visits on 03/09/23.    The ASCVD Risk score (Arnett DK, et al., 2019) failed to calculate for the following reasons:   The 2019 ASCVD risk score is only valid for ages 62 to 61    Assessment & Plan:   Amardeep was seen today for hypertension.  Diagnoses and all orders for this visit:  Primary hypertension Uncontrolled. Increase lisinopril to 20 mg daily. Continue to monitor BP at home. Will recheck  in 2 weeks.  -     BMP8+EGFR  Return in about 2 weeks (around 03/23/2023) for nurse visit for BP, 3 month follow up with me.   The patient indicates understanding of these issues and agrees with the plan.   Gabriel Earing, FNP

## 2023-03-10 LAB — BMP8+EGFR
BUN/Creatinine Ratio: 15 (ref 9–20)
BUN: 15 mg/dL (ref 6–20)
CO2: 22 mmol/L (ref 20–29)
Calcium: 9.4 mg/dL (ref 8.7–10.2)
Chloride: 103 mmol/L (ref 96–106)
Creatinine, Ser: 1.02 mg/dL (ref 0.76–1.27)
Glucose: 115 mg/dL — ABNORMAL HIGH (ref 70–99)
Potassium: 4.4 mmol/L (ref 3.5–5.2)
Sodium: 142 mmol/L (ref 134–144)
eGFR: 101 mL/min/{1.73_m2} (ref >59)

## 2023-03-12 ENCOUNTER — Encounter: Payer: Self-pay | Admitting: Family Medicine

## 2023-03-12 DIAGNOSIS — G8929 Other chronic pain: Secondary | ICD-10-CM

## 2023-03-15 ENCOUNTER — Ambulatory Visit: Payer: BC Managed Care – PPO | Admitting: Family Medicine

## 2023-03-23 ENCOUNTER — Ambulatory Visit (INDEPENDENT_AMBULATORY_CARE_PROVIDER_SITE_OTHER): Payer: BC Managed Care – PPO | Admitting: *Deleted

## 2023-03-23 VITALS — BP 143/71 | HR 85

## 2023-03-23 DIAGNOSIS — I10 Essential (primary) hypertension: Secondary | ICD-10-CM

## 2023-03-23 NOTE — Progress Notes (Signed)
 Patient is in office today for a nurse visit for Blood Pressure Check. Patient BP readings 145/81 and 143/71.

## 2023-04-03 MED ORDER — DICLOFENAC SODIUM 75 MG PO TBEC: 75.0000 mg | DELAYED_RELEASE_TABLET | Freq: Two times a day (BID) | ORAL | 0 refills | Status: DC

## 2023-04-04 ENCOUNTER — Other Ambulatory Visit: Payer: Self-pay | Admitting: Family Medicine

## 2023-04-04 DIAGNOSIS — I10 Essential (primary) hypertension: Secondary | ICD-10-CM

## 2023-04-04 MED ORDER — LISINOPRIL 30 MG PO TABS: 30.0000 mg | ORAL_TABLET | Freq: Every day | ORAL | 3 refills | Status: AC

## 2023-05-19 ENCOUNTER — Other Ambulatory Visit: Payer: Self-pay | Admitting: Family Medicine

## 2023-05-19 DIAGNOSIS — I10 Essential (primary) hypertension: Secondary | ICD-10-CM

## 2023-06-11 ENCOUNTER — Encounter: Payer: Self-pay | Admitting: Family Medicine

## 2023-06-11 ENCOUNTER — Ambulatory Visit (INDEPENDENT_AMBULATORY_CARE_PROVIDER_SITE_OTHER): Payer: BC Managed Care – PPO | Admitting: Family Medicine

## 2023-06-11 VITALS — BP 135/80 | HR 100 | Temp 98.5°F | Ht 72.0 in | Wt 282.2 lb

## 2023-06-11 DIAGNOSIS — E782 Mixed hyperlipidemia: Secondary | ICD-10-CM | POA: Diagnosis not present

## 2023-06-11 DIAGNOSIS — M25572 Pain in left ankle and joints of left foot: Secondary | ICD-10-CM

## 2023-06-11 DIAGNOSIS — I10 Essential (primary) hypertension: Secondary | ICD-10-CM | POA: Diagnosis not present

## 2023-06-11 DIAGNOSIS — G8929 Other chronic pain: Secondary | ICD-10-CM

## 2023-06-11 MED ORDER — DICLOFENAC SODIUM 75 MG PO TBEC: 75.0000 mg | DELAYED_RELEASE_TABLET | Freq: Two times a day (BID) | ORAL | 0 refills | Status: DC

## 2023-06-11 NOTE — Progress Notes (Signed)
   Established Patient Office Visit  Subjective   Patient ID: Duane Dixon, male    DOB: 01-18-1991  Age: 33 y.o. MRN: [id number]  Chief Complaint  Patient presents with   Medical Management of Chronic Issues    HPI  HTN Complaint with meds - Yes Current Medications - lisinopril  10 mg  Checking BP at home ranging 130s/70-80s Pertinent ROS:  Visual Disturbances - No Chest pain - No Dyspnea - No Palpitations - No LE edema - No  2. Obesity/HLD Improving diet. He has been increasing vegetables and reducing portion sizes.   3. Ankle pain Stable overall. Has good and bad days. Taking voltaren  with some improvement.   Past Medical History:  Diagnosis Date   Allergy     ROS As per HPI.    Objective:     BP 135/80 Comment: at home reading per pt  Pulse 100   Temp 98.5 F (36.9 C) (Temporal)   Ht 6' (1.829 m)   Wt 282 lb 3.2 oz (128 kg)   SpO2 97%   BMI 38.27 kg/m   BP Readings from Last 3 Encounters:  06/11/23 135/80  03/23/23 (!) 143/71  03/09/23 (!) 143/77   Wt Readings from Last 3 Encounters:  06/11/23 282 lb 3.2 oz (128 kg)  03/09/23 283 lb 12.8 oz (128.7 kg)  02/23/23 279 lb 9.6 oz (126.8 kg)     Physical Exam Vitals and nursing note reviewed.  Constitutional:      General: He is not in acute distress.    Appearance: He is obese. He is not ill-appearing, toxic-appearing or diaphoretic.  Cardiovascular:     Rate and Rhythm: Normal rate and regular rhythm.     Heart sounds: Normal heart sounds. No murmur heard. Pulmonary:     Effort: Pulmonary effort is normal. No respiratory distress.     Breath sounds: Normal breath sounds. No wheezing, rhonchi or rales.  Musculoskeletal:     Right lower leg: No edema.     Left lower leg: No edema.  Skin:    General: Skin is warm and dry.  Neurological:     General: No focal deficit present.     Mental Status: He is alert and oriented to person, place, and time.  Psychiatric:        Mood and Affect: Mood  normal.        Behavior: Behavior normal.    No results found for any visits on 06/11/23.    The ASCVD Risk score (Arnett DK, et al., 2019) failed to calculate for the following reasons:   The 2019 ASCVD risk score is only valid for ages 32 to 4    Assessment & Plan:   Duane Dixon was seen today for medical management of chronic issues.  Diagnoses and all orders for this visit:  Primary hypertension Well controlled on current regimen.   Mixed hyperlipidemia Diet, exercise, weight loss.   Morbid obesity (HCC) BMI 38 with HTN, HLD. Trending down. Diet, exercise, weight loss.   Chronic pain of left ankle Well controlled on current regimen.  -     diclofenac  (VOLTAREN ) 75 MG EC tablet; Take 1 tablet (75 mg total) by mouth 2 (two) times daily.   Return in about 6 months (around 12/11/2023) for chronic follow up.   The patient indicates understanding of these issues and agrees with the plan.   Albertha Huger, FNP

## 2023-12-12 ENCOUNTER — Ambulatory Visit: Admitting: Family Medicine

## 2023-12-13 ENCOUNTER — Ambulatory Visit: Admitting: Family Medicine

## 2023-12-20 ENCOUNTER — Ambulatory Visit (INDEPENDENT_AMBULATORY_CARE_PROVIDER_SITE_OTHER): Admitting: Family Medicine

## 2023-12-20 VITALS — BP 146/78 | HR 101 | Temp 97.4°F | Ht 72.0 in | Wt 270.2 lb

## 2023-12-20 DIAGNOSIS — G8929 Other chronic pain: Secondary | ICD-10-CM

## 2023-12-20 DIAGNOSIS — E782 Mixed hyperlipidemia: Secondary | ICD-10-CM | POA: Diagnosis not present

## 2023-12-20 DIAGNOSIS — M25572 Pain in left ankle and joints of left foot: Secondary | ICD-10-CM | POA: Diagnosis not present

## 2023-12-20 DIAGNOSIS — I10 Essential (primary) hypertension: Secondary | ICD-10-CM | POA: Diagnosis not present

## 2023-12-20 DIAGNOSIS — R5383 Other fatigue: Secondary | ICD-10-CM

## 2023-12-20 MED ORDER — DICLOFENAC SODIUM 75 MG PO TBEC: 75.0000 mg | DELAYED_RELEASE_TABLET | Freq: Two times a day (BID) | ORAL | 0 refills | Status: AC

## 2023-12-20 NOTE — Progress Notes (Signed)
 Established Patient Office Visit  Subjective   Patient ID: Duane Dixon, male    DOB: Jan 24, 1991  Age: 33 y.o. MRN: [id number]  Chief Complaint  Patient presents with   Medical Management of Chronic Issues    HPI  History of Present Illness   Duane Dixon is a 33 year old male with hypertension who presents for a follow-up visit.  Hypertension - Blood pressure previously measured in the high 130s/85-90 mmHg range - Not monitoring blood pressure regularly at home - Currently taking lisinopril   Fatigue and sleep disturbance - Experiences fatigue, particularly after work, impacting motivation for activities beyond necessary tasks - Sleep pattern is inconsistent: some nights feel unrested despite adequate sleep, other nights feel rested with fewer hours - Recent mattress change with ongoing adjustment - He would like his vitamins and testosterone checked. There is a family history of vitamin deficiencies   Obesity - Lost 12 pounds since April - Attributes weight loss to dietary changes  Dietary habits - Morning intake includes fruits - Lunch consists of salads - Primarily drinks water and protein shakes, including a daily Atkins protein drink  Gastrointestinal symptoms - Occasional bloating, especially after consuming salads - No swelling in feet or ankles         12/20/2023    3:54 PM 03/09/2023    3:11 PM 12/30/2021    3:03 PM  Depression screen PHQ 2/9  Decreased Interest 1 1 1   Down, Depressed, Hopeless 1 1 1   PHQ - 2 Score 2 2 2   Altered sleeping 1 1 1   Tired, decreased energy 0 1 1  Change in appetite 0 0 0  Feeling bad or failure about yourself  0 0 0  Trouble concentrating 0 0 0  Moving slowly or fidgety/restless 0 0 0  Suicidal thoughts 0 0 0  PHQ-9 Score 3 4  4    Difficult doing work/chores Not difficult at all Not difficult at all Somewhat difficult     Data saved with a previous flowsheet row definition      12/20/2023    3:55 PM 03/09/2023     3:13 PM 12/30/2021    3:03 PM 11/25/2021    3:15 PM  GAD 7 : Generalized Anxiety Score  Nervous, Anxious, on Edge 1 1 1 1   Control/stop worrying 0 0 1 1  Worry too much - different things 0 1 1 1   Trouble relaxing 1 1 1 1   Restless 0 0 0 0  Easily annoyed or irritable 0 0 0 0  Afraid - awful might happen 0 0 1 1  Total GAD 7 Score 2 3 5 5   Anxiety Difficulty Not difficult at all Not difficult at all Somewhat difficult Somewhat difficult       ROS As per HPI.    Objective:     BP (!) 146/78   Pulse (!) 101   Temp (!) 97.4 F (36.3 C) (Temporal)   Ht 6' (1.829 m)   Wt 270 lb 3.2 oz (122.6 kg)   SpO2 100%   BMI 36.65 kg/m  BP Readings from Last 3 Encounters:  12/20/23 (!) 146/78  06/11/23 135/80  03/23/23 (!) 143/71   Wt Readings from Last 3 Encounters:  12/20/23 270 lb 3.2 oz (122.6 kg)  06/11/23 282 lb 3.2 oz (128 kg)  03/09/23 283 lb 12.8 oz (128.7 kg)     Physical Exam Vitals and nursing note reviewed.  Constitutional:      General: He is not in  acute distress.    Appearance: He is obese. He is not ill-appearing, toxic-appearing or diaphoretic.  Cardiovascular:     Rate and Rhythm: Normal rate and regular rhythm.     Heart sounds: Normal heart sounds. No murmur heard. Pulmonary:     Effort: Pulmonary effort is normal. No respiratory distress.     Breath sounds: Normal breath sounds. No wheezing, rhonchi or rales.  Musculoskeletal:     Right lower leg: No edema.     Left lower leg: No edema.  Skin:    General: Skin is warm and dry.  Neurological:     General: No focal deficit present.     Mental Status: He is alert and oriented to person, place, and time.  Psychiatric:        Mood and Affect: Mood normal.        Behavior: Behavior normal.      No results found for any visits on 12/20/23.    The ASCVD Risk score (Arnett DK, et al., 2019) failed to calculate for the following reasons:   The 2019 ASCVD risk score is only valid for ages 11 to  36    Assessment & Plan:   Ad was seen today for medical management of chronic issues.  Diagnoses and all orders for this visit:  Primary hypertension -     TSH -     CMP14+EGFR  Mixed hyperlipidemia -     Lipid panel  Morbid obesity (HCC) -     VITAMIN D 25 Hydroxy (Vit-D Deficiency, Fractures) -     HgB A1c  Chronic pain of left ankle -     diclofenac  (VOLTAREN ) 75 MG EC tablet; Take 1 tablet (75 mg total) by mouth 2 (two) times daily.  Other fatigue -     Testosterone,Free and Total -     Anemia Profile B -     VITAMIN D 25 Hydroxy (Vit-D Deficiency, Fractures)   Assessment and Plan    Primary hypertension Blood pressure elevated today - Check blood pressure at home multiple times and report readings via MyChart. - Consider increasing lisinopril  to 40 mg if home readings are not at goal.  Morbid obesity due to excess calories Weight decreased by 12 pounds since April. Diet includes fruits, salads, and protein drinks. - Continue current dietary habits and monitor weight.  Mixed hyperlipidemia Previous LDL levels were good. Cholesterol check is due. - Schedule fasting lab appointment to check cholesterol, kidney and liver function, and thyroid  function.  Chronic ankle pain Ankle pain unchanged. - Refilled diclofenac  prescription.  Fatigue Reports fatigue and variable sleep quality.  - Ordered lab tests for testosterone, vitamin D, iron levels, B12, folate, and ferritin. - Schedule lab appointment before 10:15 AM for testosterone testing.      Return in about 6 months (around 06/18/2024) for chronic follow up.   The patient indicates understanding of these issues and agrees with the plan.  Annabella CHRISTELLA Search, FNP

## 2024-01-03 ENCOUNTER — Other Ambulatory Visit

## 2024-01-03 DIAGNOSIS — I10 Essential (primary) hypertension: Secondary | ICD-10-CM

## 2024-01-03 DIAGNOSIS — E782 Mixed hyperlipidemia: Secondary | ICD-10-CM

## 2024-01-03 DIAGNOSIS — R5383 Other fatigue: Secondary | ICD-10-CM

## 2024-01-03 LAB — LIPID PANEL

## 2024-01-03 LAB — BAYER DCA HB A1C WAIVED: HB A1C (BAYER DCA - WAIVED): 5.3 % (ref 4.8–5.6)

## 2024-01-04 ENCOUNTER — Ambulatory Visit: Payer: Self-pay | Admitting: Family Medicine

## 2024-01-04 DIAGNOSIS — E559 Vitamin D deficiency, unspecified: Secondary | ICD-10-CM

## 2024-01-04 LAB — ANEMIA PROFILE B
Basophils Absolute: 0 x10E3/uL (ref 0.0–0.2)
Basos: 1 %
EOS (ABSOLUTE): 0.1 x10E3/uL (ref 0.0–0.4)
Eos: 2 %
Ferritin: 428 ng/mL — ABNORMAL HIGH (ref 30–400)
Folate: 13.9 ng/mL (ref >3.0)
Hematocrit: 44.7 % (ref 37.5–51.0)
Hemoglobin: 14.9 g/dL (ref 13.0–17.7)
Immature Grans (Abs): 0 x10E3/uL (ref 0.0–0.1)
Immature Granulocytes: 0 %
Iron Saturation: 35 % (ref 15–55)
Iron: 103 ug/dL (ref 38–169)
Lymphocytes Absolute: 1.2 x10E3/uL (ref 0.7–3.1)
Lymphs: 22 %
MCH: 31.6 pg (ref 26.6–33.0)
MCHC: 33.3 g/dL (ref 31.5–35.7)
MCV: 95 fL (ref 79–97)
Monocytes Absolute: 0.7 x10E3/uL (ref 0.1–0.9)
Monocytes: 13 %
Neutrophils Absolute: 3.4 x10E3/uL (ref 1.4–7.0)
Neutrophils: 61 %
Platelets: 219 x10E3/uL (ref 150–450)
RBC: 4.71 x10E6/uL (ref 4.14–5.80)
RDW: 12.8 % (ref 11.6–15.4)
Retic Ct Pct: 1.9 % (ref 0.6–2.6)
Total Iron Binding Capacity: 297 ug/dL (ref 250–450)
UIBC: 194 ug/dL (ref 111–343)
Vitamin B-12: 676 pg/mL (ref 232–1245)
WBC: 5.4 x10E3/uL (ref 3.4–10.8)

## 2024-01-04 LAB — LIPID PANEL
Chol/HDL Ratio: 7.7 ratio — ABNORMAL HIGH (ref 0.0–5.0)
Cholesterol, Total: 215 mg/dL — ABNORMAL HIGH (ref 100–199)
HDL: 28 mg/dL — ABNORMAL LOW (ref >39)
LDL Chol Calc (NIH): 118 mg/dL — ABNORMAL HIGH (ref 0–99)
Triglycerides: 395 mg/dL — ABNORMAL HIGH (ref 0–149)
VLDL Cholesterol Cal: 69 mg/dL — ABNORMAL HIGH (ref 5–40)

## 2024-01-04 LAB — CMP14+EGFR
ALT: 26 IU/L (ref 0–44)
AST: 19 IU/L (ref 0–40)
Albumin: 4.5 g/dL (ref 4.1–5.1)
Alkaline Phosphatase: 59 IU/L (ref 47–123)
BUN/Creatinine Ratio: 18 (ref 9–20)
BUN: 19 mg/dL (ref 6–20)
Bilirubin Total: 0.6 mg/dL (ref 0.0–1.2)
CO2: 21 mmol/L (ref 20–29)
Calcium: 9 mg/dL (ref 8.7–10.2)
Chloride: 103 mmol/L (ref 96–106)
Creatinine, Ser: 1.06 mg/dL (ref 0.76–1.27)
Globulin, Total: 2.2 g/dL (ref 1.5–4.5)
Glucose: 95 mg/dL (ref 70–99)
Potassium: 4.7 mmol/L (ref 3.5–5.2)
Sodium: 138 mmol/L (ref 134–144)
Total Protein: 6.7 g/dL (ref 6.0–8.5)
eGFR: 96 mL/min/1.73 (ref >59)

## 2024-01-04 LAB — TSH: TSH: 2.79 u[IU]/mL (ref 0.450–4.500)

## 2024-01-04 LAB — VITAMIN D 25 HYDROXY (VIT D DEFICIENCY, FRACTURES): Vit D, 25-Hydroxy: 21.3 ng/mL — ABNORMAL LOW (ref 30.0–100.0)

## 2024-01-04 MED ORDER — VITAMIN D (ERGOCALCIFEROL) 1.25 MG (50000 UNIT) PO CAPS: 50000.0000 [IU] | ORAL_CAPSULE | ORAL | 0 refills | Status: AC

## 2024-06-18 ENCOUNTER — Ambulatory Visit: Admitting: Family Medicine
